# Patient Record
Sex: Male | Born: 1965 | Race: White | Hispanic: No | Marital: Married | Smoking: Former smoker
Health system: Southern US, Community
[De-identification: ages and names within clinical notes are randomized; demographics above are authoritative.]

## PROBLEM LIST (undated history)

## (undated) DIAGNOSIS — Z8619 Personal history of other infectious and parasitic diseases: Secondary | ICD-10-CM

## (undated) DIAGNOSIS — F411 Generalized anxiety disorder: Secondary | ICD-10-CM

## (undated) HISTORY — DX: Personal history of other infectious and parasitic diseases: Z86.19

## (undated) HISTORY — DX: Generalized anxiety disorder: F41.1

## (undated) HISTORY — PX: TONSILLECTOMY AND ADENOIDECTOMY: SUR1326

## (undated) HISTORY — DX: Gilbert syndrome: E80.4

## (undated) HISTORY — PX: HERNIA REPAIR: SHX51

---

## 2003-08-10 HISTORY — PX: FINGER SURGERY: SHX640

## 2006-08-05 ENCOUNTER — Emergency Department (HOSPITAL_COMMUNITY): Admission: EM | Admit: 2006-08-05 | Discharge: 2006-08-05 | Payer: Self-pay | Admitting: Family Medicine

## 2006-08-11 ENCOUNTER — Emergency Department (HOSPITAL_COMMUNITY): Admission: EM | Admit: 2006-08-11 | Discharge: 2006-08-11 | Payer: Self-pay | Admitting: Family Medicine

## 2008-06-06 ENCOUNTER — Ambulatory Visit: Payer: Self-pay | Admitting: Internal Medicine

## 2008-06-06 DIAGNOSIS — J329 Chronic sinusitis, unspecified: Secondary | ICD-10-CM | POA: Insufficient documentation

## 2008-06-06 DIAGNOSIS — R000 Tachycardia, unspecified: Secondary | ICD-10-CM | POA: Insufficient documentation

## 2008-06-06 DIAGNOSIS — G4700 Insomnia, unspecified: Secondary | ICD-10-CM | POA: Insufficient documentation

## 2008-06-27 ENCOUNTER — Ambulatory Visit: Payer: Self-pay | Admitting: Internal Medicine

## 2008-06-27 LAB — CONVERTED CEMR LAB
ALT: 23 units/L (ref 0–53)
Basophils Relative: 0.7 % (ref 0.0–3.0)
Bilirubin, Direct: 0.2 mg/dL (ref 0.0–0.3)
CO2: 32 meq/L (ref 19–32)
Calcium: 9.1 mg/dL (ref 8.4–10.5)
Glucose, Bld: 109 mg/dL — ABNORMAL HIGH (ref 70–99)
Hemoglobin: 16.5 g/dL (ref 13.0–17.0)
Leukocytes, UA: NEGATIVE
Lymphocytes Relative: 33.3 % (ref 12.0–46.0)
Monocytes Relative: 9.7 % (ref 3.0–12.0)
Neutro Abs: 2.8 10*3/uL (ref 1.4–7.7)
Nitrite: NEGATIVE
RBC: 5.01 M/uL (ref 4.22–5.81)
RDW: 11.4 % — ABNORMAL LOW (ref 11.5–14.6)
Sodium: 143 meq/L (ref 135–145)
Specific Gravity, Urine: >=1.03 (ref 1.000–1.03)
TSH: 0.53 microintl units/mL (ref 0.35–5.50)
Total CHOL/HDL Ratio: 5.8
Total Protein: 7 g/dL (ref 6.0–8.3)
WBC: 5.1 10*3/uL (ref 4.5–10.5)
pH: 5.5 (ref 5.0–8.0)

## 2008-06-28 ENCOUNTER — Telehealth: Payer: Self-pay | Admitting: Internal Medicine

## 2008-06-30 ENCOUNTER — Ambulatory Visit: Payer: Self-pay | Admitting: Internal Medicine

## 2008-07-04 ENCOUNTER — Telehealth: Payer: Self-pay | Admitting: Internal Medicine

## 2008-07-04 ENCOUNTER — Ambulatory Visit: Payer: Self-pay | Admitting: Cardiology

## 2010-06-18 ENCOUNTER — Ambulatory Visit: Payer: Self-pay | Admitting: Internal Medicine

## 2010-06-18 ENCOUNTER — Ambulatory Visit: Payer: Self-pay | Admitting: Cardiology

## 2010-06-18 DIAGNOSIS — R109 Unspecified abdominal pain: Secondary | ICD-10-CM

## 2010-06-18 LAB — CONVERTED CEMR LAB
BUN: 13 mg/dL (ref 6–23)
Basophils Absolute: 0 10*3/uL (ref 0.0–0.1)
Basophils Relative: 1 % (ref 0–1)
Blood in Urine, dipstick: NEGATIVE
Calcium: 9.9 mg/dL (ref 8.4–10.5)
Creatinine, Ser: 1.04 mg/dL (ref 0.40–1.50)
Eosinophils Absolute: 0.1 10*3/uL (ref 0.0–0.7)
Eosinophils Relative: 1 % (ref 0–5)
HCT: 46.4 % (ref 39.0–52.0)
Hemoglobin: 16.2 g/dL (ref 13.0–17.0)
MCHC: 34.9 g/dL (ref 30.0–36.0)
MCV: 90.4 fL (ref 78.0–100.0)
Monocytes Absolute: 0.6 10*3/uL (ref 0.1–1.0)
Nitrite: NEGATIVE
Protein, U semiquant: NEGATIVE
RDW: 12.1 % (ref 11.5–15.5)
Specific Gravity, Urine: 1.01
Urobilinogen, UA: 0.2
WBC Urine, dipstick: NEGATIVE

## 2010-06-19 ENCOUNTER — Encounter: Payer: Self-pay | Admitting: Internal Medicine

## 2010-06-19 ENCOUNTER — Telehealth: Payer: Self-pay | Admitting: Internal Medicine

## 2010-07-03 ENCOUNTER — Telehealth: Payer: Self-pay | Admitting: Internal Medicine

## 2010-09-28 ENCOUNTER — Ambulatory Visit
Admission: RE | Admit: 2010-09-28 | Discharge: 2010-09-28 | Payer: Self-pay | Source: Home / Self Care | Attending: Family | Admitting: Family

## 2010-09-28 DIAGNOSIS — J069 Acute upper respiratory infection, unspecified: Secondary | ICD-10-CM | POA: Insufficient documentation

## 2010-10-09 NOTE — Progress Notes (Signed)
Summary: Status Update  ---- Converted from flag ---- ---- 06/29/2010 5:31 PM, D. Thomos Lemons DO wrote: call pt on Monday AM.  Abd pain resolved? ------------------------------  Phone Note Outgoing Call   Call placed by: Glendell Docker CMA,  July 03, 2010 8:35 AM Call placed to: Patient Summary of Call: call placed to patient at [phone]  per Dr Artist Pais instructions. Patient states that his abdomen pain has resolved and he is doing much better. He has no concerns at this time. Initial call taken by: Glendell Docker CMA,  July 03, 2010 8:38 AM

## 2010-10-09 NOTE — Assessment & Plan Note (Signed)
Summary: ABD PAIN/HEA   Vital Signs:  Patient profile:   45 year old male Height:      71 inches Weight:      173.25 pounds BMI:     24.25 O2 Sat:      99 % on Room air Temp:     98.3 degrees F oral Pulse rate:   92 / minute Pulse rhythm:   irregular Resp:     18 per minute BP sitting:   106 / 70  (right arm) Cuff size:   regular  Vitals Entered By: Glendell Docker CMA (June 18, 2010 2:10 PM)  O2 Flow:  Room air CC: abdominal pain Is Patient Diabetic? No Pain Assessment Patient in pain? yes     Location: abdomen Intensity: 4 Type: aching Onset of pain  Constant Comments c/o lower abdominal pain for the past week started out in lower back, evaluation of knot mid sternum, onset a month ago, denies associated pain   Primary Care Provider:  Dondra Spry DO  CC:  abdominal pain.  History of Present Illness: 45 y/o white male c/o abd pain started with left flank pain woke him up,  initally intense and sharp  back pain has improved but he has lower pelvic discomfort described as dull ache some activity made it worse  no change in bowel  frequent urination no other urinary  Preventive Screening-Counseling & Management  Alcohol-Tobacco     Smoking Status: quit  Allergies (verified): No Known Drug Allergies  Past History:  Past Medical History: Chronic insomnia associated with shift work Allergic rhinitis  Hx of chronic sinusitis - Previously evaluated by ENT Dr. 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   Lungs:  normal respiratory effort and normal breath sounds.   Heart:  normal rate, regular rhythm, and no gallop.   Extremities:  No clubbing, cyanosis, edema   Impression & Recommendations:  Problem # 1:  ABDOMINAL PAIN, LOWER (ICD-789.09) abd pain suspicous for kidney stone.  check CT of abd and pelvis increase fluids use vicodin as directed Patient advised to call office if symptoms persist or worsen.  Orders: T-Basic Metabolic Panel [id number]) T-CBC w/Diff [phone]) CT without Contrast (CT w/o contrast)  His updated medication list for this problem includes:    Hydrocodone-acetaminophen 5-500 Mg Tabs (Hydrocodone-acetaminophen) ..... One by mouth two times a day prn  Complete Medication List: 1)  Fish Oil 1000 Mg Caps (Omega-3 fatty acids) .... Take 1 capsule by mouth once a day 2)  L-lysine 500 Mg Tabs (Lysine) .... Take 1 tablet by mouth once a day 3)  Hydrocodone-acetaminophen 5-500 Mg Tabs (Hydrocodone-acetaminophen) .... One by mouth two times a day prn 4)  Levofloxacin 500 Mg Tabs (Levofloxacin) .... One by mouth once daily  Other Orders: UA Dipstick w/o Micro (manual) (53664) Flu Vaccine 41yrs + (40347) Admin 1st Vaccine (42595)  Patient Instructions: 1)  We will call you re:  CT of abd and pelvis  results Prescriptions: HYDROCODONE-ACETAMINOPHEN 5-500 MG TABS (HYDROCODONE-ACETAMINOPHEN) one by mouth two times a day prn  #30 x 0   Entered and Authorized by:   D. Thomos Lemons DO   Signed by:   D. Thomos Lemons DO on 06/18/2010   Method used:   Print then Give to Patient   RxID:   [id number]   Current Allergies (reviewed today): No known allergies     Immunizations Administered:  Influenza Vaccine # 1:    Vaccine Type: Fluvax 3+    Site: left deltoid    Mfr: GlaxoSmithKline    Dose: 0.5 ml    Route: IM    Given by: Glendell Docker CMA    Exp. Date: 03/09/2011    Lot #: [id number]     VIS given: 04/03/10 version given June 18, 2010.  Flu Vaccine Consent Questions:    Do you have a history of severe allergic reactions to this vaccine? no    Any prior history of allergic reactions to egg and/or gelatin? no    Do you have a sensitivity to the preservative Thimersol? no    Do you have a past history of Guillan-Barre Syndrome? no    Do you currently have an acute febrile illness? no    Have you ever had a severe reaction to latex? no    Vaccine information given and explained to patient? yes   Laboratory Results   Urine Tests    Routine Urinalysis   Color: yellow Appearance: Clear Glucose: negative   (Normal Range: Negative) Bilirubin: negative   (Normal Range: Negative) Ketone: negative   (Normal Range: Negative) Spec. Gravity: 1.010   (Normal Range: 1.003-1.035) Blood: negative   (Normal Range: Negative) pH: 5.0   (Normal Range: 5.0-8.0) Protein: negative   (Normal Range: Negative) Urobilinogen: 0.2   (Normal Range: 0-1) Nitrite: negative   (Normal Range: Negative) Leukocyte Esterace: negative   (Normal Range: Negative)

## 2010-10-09 NOTE — Progress Notes (Signed)
  Phone Note Outgoing Call   Summary of Call: LMOVM - pt advised to call back re:  CT results Initial call taken by: D. Thomos Lemons DO,  June 19, 2010 1:54 PM  Follow-up for Phone Call        informed pt of CT results.  pt still having lower abd pain he also describes intermittent scrotal discomfort. question epididymitis. tx with levaquin pt to call back end of week if symptoms not improved Follow-up by: D. Thomos Lemons DO,  June 19, 2010 2:19 PM    New/Updated Medications: LEVOFLOXACIN 500 MG TABS (LEVOFLOXACIN) one by mouth once daily Prescriptions: LEVOFLOXACIN 500 MG TABS (LEVOFLOXACIN) one by mouth once daily  #10 x 0   Entered and Authorized by:   D. Thomos Lemons DO   Signed by:   D. Thomos Lemons DO on 06/19/2010   Method used:   Electronically to        [facility] # [phone]* (retail)       [address] [place], [place]  [address]       Ph: [phone]       Fax: [phone]   RxID:   [id number]

## 2010-10-11 NOTE — Assessment & Plan Note (Signed)
Summary: laringytis/mhf--rm 4   Vital Signs:  Patient profile:   45 year old male Height:      71 inches Weight:      179 pounds BMI:     25.06 Temp:     97.9 degrees F oral Pulse rate:   84 / minute Pulse rhythm:   regular Resp:     16 per minute BP sitting:   122 / 80  (right arm) Cuff size:   regular  Vitals Entered By: Mervin Kung CMA Duncan Dull) (September 28, 2010 8:42 AM) CC: Pt states he has had sinus drainage, sore throat and low grade fever x 1 day. Is Patient Diabetic? No Pain Assessment Patient in pain? no      Comments Pt has completed hydrocodone and levofloxacin.  All other med doses and directions are correct. Nicki Guadalajara Fergerson CMA Duncan Dull)  September 28, 2010 8:47 AM    Primary Care Provider:  Dondra Spry DO  CC:  Pt states he has had sinus drainage and sore throat and low grade fever x 1 day.Marland Kitchen  History of Present Illness: Xavier Gonzalez is a 45 year old male with 2-3 day of hx of nasal congestion.  Low grade temperature. Tmax 101 yesterday.   Laryngitis since yesterday.  Mild cough. Mild yellow discharge when he blows his nose.  Has tried sudafed, alka selzer, sinus rinse with mild improvement.  Has not taken any medications this AM.  Allergies (verified): No Known Drug Allergies  Past History:  Past Medical History: Last updated: 06/18/2010 Chronic insomnia associated with shift work Allergic rhinitis  Hx of chronic sinusitis - Previously evaluated by ENT Dr. Christell Constant in [facility] Gilbert's Syndrome   Past Surgical History: Last updated: 06/18/2010 Reconstructive hand surgery 2004-2005 for crushed fifth digit Tonsillectomy 1988     Review of Systems       see HPI  Physical Exam  General:  Well-developed,well-nourished,in no acute distress; alert,appropriate and cooperative throughout examination Head:  Normocephalic and atraumatic without obvious abnormalities. No apparent alopecia or balding. Eyes:  PERRLA, sclera clear Mouth:  + pharyngeal erythema  without exudates Neck:  No deformities, masses, or tenderness noted. Lungs:  Normal respiratory effort, chest expands symmetrically. Lungs are clear to auscultation, no crackles or wheezes. Heart:  Normal rate and regular rhythm. S1 and S2 normal without gallop, murmur, click, rub or other extra sounds. Psych:  Cognition and judgment appear intact. Alert and cooperative with normal attention span and concentration. No apparent delusions, illusions, hallucinations   Impression & Recommendations:  Problem # 1:  UPPER RESPIRATORY INFECTION (ICD-465.9) Assessment New  Rapid strep is negative.  Symptoms consistent with URI.  Recommended supportive measures as listed in pt. sign out.  Pt instructed to call if symptoms worsen, or if no improvement in 48-72 hours.    Complete Medication List: 1)  Fish Oil 1000 Mg Caps (Omega-3 fatty acids) .... Take 1 capsule by mouth once a day 2)  L-lysine 500 Mg Tabs (Lysine) .... Take 1 tablet by mouth once a day  Other Orders: Rapid Strep ([address])  Patient Instructions: 1)  Gargle twice daily with salt water. 2)  Take Tylenol 650mg  every 6 hours as needed for pain 3)  You may use over the counter Cepacol lozenges or Chloraseptic spray as needed for sore throat. 4)  Call if you are not feeling better in 48 to 72 hours.   Orders Added: 1)  Rapid Strep [87880] 2)  Est. Patient Level III [[address]]  Current Allergies (reviewed today): No known allergies   Laboratory Results   Date/Time Reported: Mervin Kung CMA Duncan Dull)  September 28, 2010 9:23 AM   Other Tests  Rapid Strep: negative  Kit Test Internal QC: Positive   (Normal Range: Negative)

## 2011-10-09 ENCOUNTER — Emergency Department (INDEPENDENT_AMBULATORY_CARE_PROVIDER_SITE_OTHER)
Admission: EM | Admit: 2011-10-09 | Discharge: 2011-10-09 | Disposition: A | Discharge status: Home / Self Care | Payer: Managed Care, Other (non HMO) | Source: Home / Self Care | Attending: Emergency Medicine | Admitting: Emergency Medicine

## 2011-10-09 DIAGNOSIS — R0789 Other chest pain: Secondary | ICD-10-CM

## 2011-10-09 NOTE — ED Provider Notes (Signed)
History     CSN: [id number]  Arrival date & time 10/09/11  1044   First MD Initiated Contact with Patient 10/09/11 1119      No chief complaint on file.   (Consider location/radiation/quality/duration/timing/severity/associated sxs/prior treatment) HPI He complains of left-sided chest pain for last 4-5 days. 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 No shortness of breath, sweating. He does not have any history of any major medical issues, no hypertension. No cholesterol. His parents are both alive and healthy. He is not a smoker. He is always been thin and healthy. No upper respiratory symptoms. No other symptoms at all. He states that at worst the pain is about 2 or 3/10 and penicillin there for a while. He states that when he presses on his left chest it tends to go low worst.  No past medical history on file.  No past surgical history on file.  No family history on file.  History  Substance Use Topics  . Smoking status: Not on file  . Smokeless tobacco: Not on file  . Alcohol Use: Not on file      Review of Systems  Allergies  Review of patient's allergies indicates not on file.  Home Medications  No current outpatient prescriptions on file.  There were no vitals taken for this visit.  Physical Exam  Nursing note and vitals reviewed. Constitutional: He is oriented to person, place, and time. He appears well-developed and well-nourished.  HENT:  Head: Normocephalic and atraumatic.  Eyes: No scleral icterus.  Neck: Neck supple.  Cardiovascular: Normal rate, regular rhythm, normal heart sounds and normal pulses.   Pulmonary/Chest: Effort normal and breath sounds normal. No respiratory distress. He has no wheezes. He has no rhonchi. He exhibits tenderness. He exhibits no mass, no crepitus, no edema and no swelling.    Neurological: He is alert and oriented to person, place, and time.  Skin: Skin is warm and dry.    Psychiatric: He has a normal mood and affect. His speech is normal.    ED Course  Procedures (including critical care time)  Labs Reviewed - No data to display No results found.   1. Atypical chest pain       MDM   I do not believe that his chest pain is cardiac in etiology as he has absolutely no risk factors and that it has been lingering for the last 4-5 days. I also do not feel that this is reflux occurred, however I told him that it may be reasonable to take some Prilosec over-the-counter for the next few days to see if this does help relieve it. Instead I feel the diagnosis most appropriate as costochondritis or chest wall strain. I've educated him on this. I gave him a handout. ER precautions for worsening chest pain, shortness of breath. Or if the pain is just continuing despite conservative treatment, he will call back clinic and we will get him in to see a cardiologist, likely for an exercise stress test. An EKG was done in clinic.  Lily Kocher, MD 10/09/11 1124

## 2011-10-12 ENCOUNTER — Telehealth: Payer: Self-pay | Admitting: Family Medicine

## 2012-01-31 ENCOUNTER — Emergency Department
Admission: EM | Admit: 2012-01-31 | Discharge: 2012-01-31 | Disposition: A | Discharge status: Home / Self Care | Payer: Managed Care, Other (non HMO) | Source: Home / Self Care | Attending: Emergency Medicine | Admitting: Emergency Medicine

## 2012-01-31 ENCOUNTER — Encounter: Payer: Self-pay | Admitting: Family Medicine

## 2012-01-31 ENCOUNTER — Ambulatory Visit (INDEPENDENT_AMBULATORY_CARE_PROVIDER_SITE_OTHER): Payer: Managed Care, Other (non HMO) | Admitting: Family Medicine

## 2012-01-31 ENCOUNTER — Encounter: Payer: Self-pay | Admitting: *Deleted

## 2012-01-31 VITALS — BP 127/89 | HR 102 | Ht 71.0 in | Wt 174.0 lb

## 2012-01-31 DIAGNOSIS — R229 Localized swelling, mass and lump, unspecified: Secondary | ICD-10-CM

## 2012-01-31 DIAGNOSIS — R222 Localized swelling, mass and lump, trunk: Secondary | ICD-10-CM

## 2012-01-31 NOTE — ED Provider Notes (Signed)
History     CSN: [id number]  Arrival date & time 01/31/12  1137   First MD Initiated Contact with Patient 01/31/12 1143      Chief Complaint  Patient presents with  . Mass    lump on left side    (Consider location/radiation/quality/duration/timing/severity/associated sxs/prior treatment) HPI This patient was diagnosed with costochondritis about 10 weeks ago.  He still says that he has a lump and swelling on the left side especially for the last month or so.  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  We'll swelling is not really painful but occasionally he does have a twinge of pain on that side.  Her cardiac risk factors, no hypertension no dyslipidemia, nonsmoker, no family history of cardiovascular disease, and he is active without any chest pain.  No shortness of breath.  History reviewed. No pertinent past medical history.  History reviewed. No pertinent past surgical history.  No family history on file.  History  Substance Use Topics  . Smoking status: Former Games developer  . Smokeless tobacco: Never Used  . Alcohol Use: Yes      Review of Systems  All other systems reviewed and are negative.    Allergies  Review of patient's allergies indicates no known allergies.  Home Medications   Current Outpatient Rx  Name Route Sig Dispense Refill  . LYSINE 500 MG PO CAPS Oral Take by mouth.      BP 146/84  Pulse 91  Temp(Src) 98.4 F (36.9 C) (Oral)  Resp 14  Ht 6' (1.829 m)  Wt 173 lb (78.472 kg)  BMI 23.46 kg/m2  SpO2 97%  Physical Exam  Nursing note and vitals reviewed. Constitutional: He is oriented to person, place, and time. He appears well-developed and well-nourished.  HENT:  Head: Normocephalic and atraumatic.  Eyes: No scleral icterus.  Neck: Neck supple.  Cardiovascular: Regular rhythm and normal heart sounds.   Pulmonary/Chest: Effort normal and breath sounds normal. No respiratory distress. He  has no decreased breath sounds. He has no wheezes. He has no rhonchi.         When he raises his arms there is an area of mild swelling compared to the contralateral side which is approximately 7 cm in size.  It is at the general area of the serratus anterior muscle.  No tenderness to palpation.  Neurological: He is alert and oriented to person, place, and time.  Skin: Skin is warm and dry.  Psychiatric: He has a normal mood and affect. His speech is normal.    ED Course  Procedures (including critical care time)  Labs Reviewed - No data to display No results found.   1. Soft tissue swelling of chest wall       MDM   I would refer him to Dr. Pearletha Forge in sports medicine.  I let him know that probably the best modality at this time would be to get an ultrasound of the area to rule out a cyst.  This is likely soft tissue swelling secondary to a serratus anterior strain.  With his continued golf and his job, he is likely not rested enough.  I do not believe that any kind of intrathoracic etiology, however if nothing is found on the ultrasound, that I advised him to followup with his PCP.  At that time a chest x-ray or chest CT could be offered versus blood work.  ER precautions are given for severe chest  pain, shortness of breath.  Marlaine Hind, MD 01/31/12 [phone]

## 2012-01-31 NOTE — Progress Notes (Signed)
  Subjective:    Patient ID: Xavier Gonzalez, male    DOB: October 28, 1965, 46 y.o.   MRN: [id number]  PCP: None  HPI 46 yo M here for left chest wall mass.  Patient was seen previously at [facility] for left sided chest pain (3 months ago) - dx with costochondritis. Over past 4 weeks noticed increased swelling left side of chest wall laterally. Initially was painful here but now not having pain in this location. No redness, drainage. No pain with movements. He does golf a lot but doesn't recall any specific injury. Iced before, not on any medications. Here for an ultrasound.  No past medical history on file.  Current Outpatient Prescriptions on File Prior to Visit  Medication Sig Dispense Refill  . Lysine 500 MG CAPS Take by mouth.        No past surgical history on file.  No Known Allergies  History   Social History  . Marital Status: Married    Spouse Name: N/A    Number of Children: N/A  . Years of Education: N/A   Occupational History  . Not on file.   Social History Main Topics  . Smoking status: Former Games developer  . Smokeless tobacco: Never Used  . Alcohol Use: Yes  . Drug Use: No  . Sexually Active:    Other Topics Concern  . Not on file   Social History Narrative  . No narrative on file    No family history on file.  BP 127/89  Pulse 102  Ht 5\' 11"  (1.803 m)  Wt 174 lb (78.926 kg)  BMI 24.27 kg/m2  Review of Systems See HPI above.    Objective:   Physical Exam Gen: NAD  Chest: Mild swelling left serratus anterior muscle compared to right.  No bruising, erythema, palpable mass. No TTP throughout this area or left chest wall. FROM shoulders - no pain and no scapular winging. Strength 5/5 shoulders.  MSK u/s: No evidence of cyst, lipoma in area of swelling.  Muscle fibers of serratus without large tear or defect.  He does have increased neovascularity within musculature in area of swelling.  No bony irregularities of ribs.     Assessment & Plan:  1.  Chest mass - consistent with serratus anterior strain/spasm.  No evidence bony abnormalities, lipoma, cyst in this location.  I reassured patient from an MSK perspective.  Offered PT but he declined - to call if he wants to go ahead with this but only intermittently having pain now.  Agree with prior assessment that if pain worsens would consider referring him to cardiology for eval, possible stress test - only risk factor remote smoking history though.  He will f/u with [place] prn.

## 2012-01-31 NOTE — ED Notes (Signed)
Patient reports he noticed a lump/swelling on his left side x 3-4 weeks. Plays golf frequently.

## 2012-01-31 NOTE — Assessment & Plan Note (Signed)
consistent with serratus anterior strain/spasm.  No evidence bony abnormalities, lipoma, cyst in this location.  I reassured patient from an MSK perspective.  Offered PT but he declined - to call if he wants to go ahead with this but only intermittently having pain now.  Agree with prior assessment that if pain worsens would consider referring him to cardiology for eval, possible stress test - only risk factor remote smoking history though.  He will f/u with [place] prn.

## 2012-08-14 DIAGNOSIS — M79646 Pain in unspecified finger(s): Secondary | ICD-10-CM | POA: Insufficient documentation

## 2012-08-14 DIAGNOSIS — T8484XA Pain due to internal orthopedic prosthetic devices, implants and grafts, initial encounter: Secondary | ICD-10-CM | POA: Insufficient documentation

## 2012-09-03 ENCOUNTER — Telehealth: Payer: Self-pay | Admitting: Emergency Medicine

## 2012-09-03 ENCOUNTER — Emergency Department (INDEPENDENT_AMBULATORY_CARE_PROVIDER_SITE_OTHER)
Admission: EM | Admit: 2012-09-03 | Discharge: 2012-09-03 | Disposition: A | Discharge status: Home / Self Care | Payer: Managed Care, Other (non HMO) | Source: Home / Self Care | Attending: Family Medicine | Admitting: Family Medicine

## 2012-09-03 ENCOUNTER — Encounter: Payer: Self-pay | Admitting: *Deleted

## 2012-09-03 DIAGNOSIS — J111 Influenza due to unidentified influenza virus with other respiratory manifestations: Secondary | ICD-10-CM

## 2012-09-03 LAB — POCT INFLUENZA A/B: Influenza B, POC: NEGATIVE

## 2012-09-03 MED ORDER — OSELTAMIVIR PHOSPHATE 75 MG PO CAPS: ORAL_CAPSULE | ORAL | Status: DC

## 2012-09-03 NOTE — ED Notes (Signed)
Patient c/o flu like symptoms, chills, fever high 100.7, sore throat, chest pain. Has tried OTC Nyquil, Ibuprofen, and cough drops with little relief.

## 2012-09-03 NOTE — ED Provider Notes (Signed)
History     CSN: [id number]  Arrival date & time 09/03/12  7829   First MD Initiated Contact with Patient 09/03/12 [phone]      Chief Complaint  Patient presents with  . Influenza   HPI  URI Symptoms Onset: 2 days  Description: generalized malaise and fatigue, low grade fever, rhinorrhea Modifying factors:  Has not had flu shot   Symptoms Nasal discharge: yes Fever: mild Sore throat: no Cough: mild Wheezing: no Ear pain: no GI symptoms: decreased appetite Sick contacts: unsure  Red Flags  Stiff neck: no Dyspnea: no Rash: no Swallowing difficulty: no  Sinusitis Risk Factors Headache/face pain: no Double sickening: no tooth pain: no  Allergy Risk Factors Sneezing: no Itchy scratchy throat: no Seasonal symptoms: no  Flu Risk Factors Headache: yes muscle aches: yes severe fatigue: yes    History reviewed. No pertinent past medical history.  History reviewed. No pertinent past surgical history.  History reviewed. No pertinent family history.  History  Substance Use Topics  . Smoking status: Former Games developer  . Smokeless tobacco: Never Used  . Alcohol Use: Yes      Review of Systems  All other systems reviewed and are negative.    Allergies  Review of patient's allergies indicates no known allergies.  Home Medications   Current Outpatient Rx  Name  Route  Sig  Dispense  Refill  . LYSINE 500 MG PO CAPS   Oral   Take by mouth.           BP 142/9  Pulse 103  Temp 98.1 F (36.7 C) (Oral)  Resp 18  Ht 6' (1.829 m)  Wt 175 lb 8 oz (79.606 kg)  BMI 23.80 kg/m2  SpO2 99%  Physical Exam  Constitutional:       Mildly ill appearing    HENT:  Head: Normocephalic and atraumatic.       +nasal erythema, rhinorrhea bilaterally, + post oropharyngeal erythema    Eyes: Conjunctivae normal are normal. Pupils are equal, round, and reactive to light.  Neck: Normal range of motion. Neck supple.  Cardiovascular: Normal rate and regular rhythm.     Pulmonary/Chest: Effort normal and breath sounds normal.  Abdominal: Soft.  Musculoskeletal: Normal range of motion.  Lymphadenopathy:    He has cervical adenopathy.  Neurological: He is alert.  Skin: Skin is warm.    ED Course  Procedures (including critical care time)   Labs Reviewed  POCT RAPID STREP A (OFFICE)  POCT INFLUENZA A/B   No results found.   1. Influenza       MDM  Rapid flu A positive.  Will start on tamfilu.  Prophylactic dosing for pt's wife.  Discussed contacting family members who have been in range within the last 24 hours.  Discussed infectious and resp red flags.  Follow up as needed.     The patient and/or caregiver has been counseled thoroughly with regard to treatment plan and/or medications prescribed including dosage, schedule, interactions, rationale for use, and possible side effects and they verbalize understanding. Diagnoses and expected course of recovery discussed and will return if not improved as expected or if the condition worsens. Patient and/or caregiver verbalized understanding.             Doree Albee, MD 09/03/12 1037

## 2016-06-12 ENCOUNTER — Ambulatory Visit (INDEPENDENT_AMBULATORY_CARE_PROVIDER_SITE_OTHER): Payer: BLUE CROSS/BLUE SHIELD | Admitting: Family Medicine

## 2016-06-12 ENCOUNTER — Encounter: Payer: Self-pay | Admitting: Family Medicine

## 2016-06-12 VITALS — BP 116/50 | HR 90 | Temp 98.2°F | Ht 72.0 in | Wt 184.8 lb

## 2016-06-12 DIAGNOSIS — Z13220 Encounter for screening for lipoid disorders: Secondary | ICD-10-CM

## 2016-06-12 DIAGNOSIS — Z131 Encounter for screening for diabetes mellitus: Secondary | ICD-10-CM

## 2016-06-12 DIAGNOSIS — E663 Overweight: Secondary | ICD-10-CM | POA: Diagnosis not present

## 2016-06-12 DIAGNOSIS — F411 Generalized anxiety disorder: Secondary | ICD-10-CM

## 2016-06-12 DIAGNOSIS — Z23 Encounter for immunization: Secondary | ICD-10-CM

## 2016-06-12 DIAGNOSIS — Z114 Encounter for screening for human immunodeficiency virus [HIV]: Secondary | ICD-10-CM

## 2016-06-12 LAB — HEMOGLOBIN A1C: HEMOGLOBIN A1C: 5.4 % (ref 4.6–6.5)

## 2016-06-12 LAB — COMPREHENSIVE METABOLIC PANEL
ALT: 21 U/L (ref 0–53)
AST: 18 U/L (ref 0–37)
Albumin: 4.1 g/dL (ref 3.5–5.2)
Alkaline Phosphatase: 76 U/L (ref 39–117)
BUN: 13 mg/dL (ref 6–23)
CALCIUM: 8.8 mg/dL (ref 8.4–10.5)
CHLORIDE: 104 meq/L (ref 96–112)
CO2: 30 meq/L (ref 19–32)
CREATININE: 0.97 mg/dL (ref 0.40–1.50)
GFR: 87.72 mL/min (ref >60.00)
GLUCOSE: 96 mg/dL (ref 70–99)
Potassium: 3.8 mEq/L (ref 3.5–5.1)
Sodium: 141 mEq/L (ref 135–145)
Total Bilirubin: 1.4 mg/dL — ABNORMAL HIGH (ref 0.2–1.2)
Total Protein: 7 g/dL (ref 6.0–8.3)

## 2016-06-12 LAB — LIPID PANEL
CHOL/HDL RATIO: 5
Cholesterol: 193 mg/dL (ref 0–200)
HDL: 38.6 mg/dL — AB (ref >39.00)
LDL CALC: 120 mg/dL — AB (ref 0–99)
NONHDL: 154.49
TRIGLYCERIDES: 170 mg/dL — AB (ref 0.0–149.0)
VLDL: 34 mg/dL (ref 0.0–40.0)

## 2016-06-12 LAB — HIV ANTIBODY (ROUTINE TESTING W REFLEX): HIV: NONREACTIVE

## 2016-06-12 MED ORDER — SERTRALINE HCL 50 MG PO TABS: 50.0000 mg | ORAL_TABLET | Freq: Every day | ORAL | 0 refills | Status: DC

## 2016-06-12 NOTE — Patient Instructions (Addendum)
Please consider counseling. The medical literature and evidence-based guidelines support it. Contact [phone] to schedule an appointment or inquire about cost/insurance coverage.

## 2016-06-12 NOTE — Progress Notes (Signed)
Pre visit review using our clinic review tool, if applicable. No additional management support is needed unless otherwise documented below in the visit note. 

## 2016-06-12 NOTE — Progress Notes (Signed)
Chief Complaint  Patient presents with  . Establish Care       New Patient Visit SUBJECTIVE: HPI: Xavier Gonzalez is an 50 y.o.male who is being seen for establishing care.  The patient was previously seen with Dr. Shawna Orleans.  Anxiety/anger As long as his mom can remember he has had anger issues. He has been married to his wife for 20 years and she has seen it get worse over time. They see a counselor together. His daughter was just diagnosed with anxiety and their counselor believes he should be started on something. He has a history of panic attacks causing a cardiac workup. He will sometimes have racing thoughts and difficulty sleeping. No self-medication or suicidal/homicidal thoughts. He does not have a depressed mood or decreased interest in doing things.    No Known Allergies  Past Medical History:  Diagnosis Date  . Gilbert's disease   . History of chicken pox    Past Surgical History:  Procedure Laterality Date  . FINGER SURGERY  08/2003  . HERNIA REPAIR    . TONSILLECTOMY AND ADENOIDECTOMY Bilateral    Social History   Social History  . Marital status: Married   Social History Main Topics  . Smoking status: Former Research scientist (life sciences)  . Smokeless tobacco: Never Used  . Alcohol use Yes  . Drug use: No   Family History  Problem Relation Age of Onset  . Diabetes Mother   . COPD Mother   . Hypertension Father   . Anxiety disorder Daughter   . Diabetes Maternal Grandmother   . COPD Maternal Grandmother   . Cancer Maternal Uncle     Current Outpatient Prescriptions:  .  L-Lysine 500 MG TABS, Take 1,000 mg by mouth daily., Disp: , Rfl:  .  sertraline (ZOLOFT) 50 MG tablet, Take 1 tablet (50 mg total) by mouth daily., Disp: 60 tablet, Rfl: 0  ROS Cardiovascular: Denies weight changes  Psych: As noted in HPI   OBJECTIVE: BP (!) 116/50 (BP Location: Left Arm, Patient Position: Sitting, Cuff Size: Large)   Pulse 90   Temp 98.2 F (36.8 C) (Oral)   Ht 6' (1.829 m)   Wt 184 lb  12.8 oz (83.8 kg)   SpO2 98%   BMI 25.06 kg/m   Constitutional: -  VS reviewed -  Well developed, well nourished, appears stated age -  No apparent distress  Psychiatric: -  Oriented to person, place, and time -  Memory intact -  Affect and mood normal -  Fluent conversation, good eye contact -  Judgment and insight age appropriate  Eye: -  Conjunctivae clear, no discharge -  Pupils symmetric, round, reactive to light  ENMT: -  Ears are patent b/l without erythema or discharge. TM's are shiny and clear b/l without evidence of effusion or infection. -  Oral mucosa without lesions, tongue and uvula midline    Tonsils not enlarged, no erythema, no exudate, trachea midline    Pharynx moist, no lesions, no erythema  Neck: -  No gross swelling, no palpable masses -  Thyroid midline, not enlarged, mobile, no palpable masses  Cardiovascular: -  RRR, no murmurs -  No LE edema  Respiratory: -  Normal respiratory effort, no accessory muscle use, no retraction -  Breath sounds equal, no wheezes, no ronchi, no crackles  Neurological:  -  CN II - XII grossly intact -  Sensation grossly intact to light touch, equal bilaterally  Skin: -  No significant lesion on  inspection -  Warm and dry to palpation   ASSESSMENT/PLAN: Generalized anxiety disorder - Plan: sertraline (ZOLOFT) 50 MG tablet  Screening cholesterol level - Plan: Lipid panel  Screening for HIV (human immunodeficiency virus) - Plan: HIV antibody  Screening for diabetes mellitus - Plan: Hemoglobin A1c  Overweight (BMI 25.0-29.9) - Plan: Comprehensive metabolic panel  Orders as above. 25 mg for 2 weeks and then 50 mg daily. Number given for counseling should he pursue it. Patient should return in 4-6 weeks to recheck mood. The patient voiced understanding and agreement to the plan.   [place], DO 06/12/16  9:12 AM

## 2016-07-24 ENCOUNTER — Encounter: Payer: Self-pay | Admitting: Family Medicine

## 2016-07-24 ENCOUNTER — Ambulatory Visit (INDEPENDENT_AMBULATORY_CARE_PROVIDER_SITE_OTHER): Payer: BLUE CROSS/BLUE SHIELD | Admitting: Family Medicine

## 2016-07-24 VITALS — BP 120/80 | HR 64 | Temp 98.0°F | Ht 71.0 in | Wt 182.2 lb

## 2016-07-24 DIAGNOSIS — F411 Generalized anxiety disorder: Secondary | ICD-10-CM

## 2016-07-24 MED ORDER — SERTRALINE HCL 50 MG PO TABS: 50.0000 mg | ORAL_TABLET | Freq: Every day | ORAL | 0 refills | Status: DC

## 2016-07-24 NOTE — Progress Notes (Signed)
Chief Complaint  Patient presents with  . Follow-up    Subjective Xavier Gonzalez presents for f/u anxiety/depression.  Diagnosed for first time around 6 weeks ago and started on Zoloft. Side effects include falling asleep earlier in night, still gets around same amount of sleep. Does not notice a significant difference, though both he and his wife believe he is less irritable and having less outbursts.  +social stressors in selling house. Has not tried anything else; is in group counseling with wife and daughter. No thoughts of harming self or others. No self-medication with alcohol, prescription drugs or illicit drugs. He believes his dose should be unchanged.  ROS Psych: No homicidal or suicidal thoughts  Past Medical History:  Diagnosis Date  . Gilbert's disease   . History of chicken pox    Family History  Problem Relation Age of Onset  . Diabetes Mother   . COPD Mother   . Hypertension Father   . Anxiety disorder Daughter   . Diabetes Maternal Grandmother   . COPD Maternal Grandmother   . Cancer Maternal Uncle      Medication List       Accurate as of 07/24/16  7:54 AM. Always use your most recent med list.          L-Lysine 500 MG Tabs Take 1,000 mg by mouth daily.   sertraline 50 MG tablet Commonly known as:  ZOLOFT Take 1 tablet (50 mg total) by mouth daily.       Exam BP 120/80 (BP Location: Left Arm, Patient Position: Sitting, Cuff Size: Small)   Pulse 64   Temp 98 F (36.7 C) (Oral)   Ht 5\' 11"  (1.803 m)   Wt 182 lb 3.2 oz (82.6 kg)   SpO2 98%   BMI 25.41 kg/m  General:  well developed, well nourished, in no apparent distress Neck: neck supple without adenopathy, thyromegaly, or masses Lungs:  clear to auscultation, breath sounds equal bilaterally, no respiratory distress Cardio:  regular rate and rhythm without murmurs, heart sounds without clicks or rubs Psych: well oriented with normal range of affect and age-appropriate  judgement/insight, alert and oriented x4.  Assessment and Plan  Generalized anxiety disorder - Plan: sertraline (ZOLOFT) 50 MG tablet  Orders as above. Doing well. Told pt that my goal is not for him to feel significantly better, as he was not having depression or chronic anxiety. He does feel his anger is more even keel. Keep dose the same for now.  F/u in 2 mo. If doing well at that time, will space out to 4 mo and then every 6 mo. The patient voiced understanding and agreement to the plan.  [place], DO 07/24/16 7:54 AM

## 2016-07-24 NOTE — Progress Notes (Signed)
Pre visit review using our clinic review tool, if applicable. No additional management support is needed unless otherwise documented below in the visit note. 

## 2016-09-23 ENCOUNTER — Encounter: Payer: Self-pay | Admitting: Family Medicine

## 2016-09-23 ENCOUNTER — Ambulatory Visit (INDEPENDENT_AMBULATORY_CARE_PROVIDER_SITE_OTHER): Payer: BLUE CROSS/BLUE SHIELD | Admitting: Family Medicine

## 2016-09-23 VITALS — BP 122/70 | HR 70 | Temp 97.7°F | Ht 71.0 in | Wt 183.0 lb

## 2016-09-23 DIAGNOSIS — F411 Generalized anxiety disorder: Secondary | ICD-10-CM

## 2016-09-23 MED ORDER — SERTRALINE HCL 50 MG PO TABS: 50.0000 mg | ORAL_TABLET | Freq: Every day | ORAL | 1 refills | Status: DC

## 2016-09-23 NOTE — Progress Notes (Signed)
Chief Complaint  Patient presents with  . Follow-up    Pt reports anxiety has been much better    Subjective Xavier Gonzalez presents for f/u anxiety/depression. He is alone today.  Diagnosed 06/12/16. On Zoloft 50 mg daily. Doing well, no side effects or concerns. Believes his dose should be the same.  He does not notice a significant difference, but his family members do and would like him to stay on it. He is no longer seeing a counselor as things have improved. No thoughts of harming self or others. No self-medication with alcohol, prescription drugs or illicit drugs.  ROS Psych: No homicidal or suicidal thoughts  Past Medical History:  Diagnosis Date  . GAD (generalized anxiety disorder)   . Gilbert's disease   . History of chicken pox    Family History  Problem Relation Age of Onset  . Diabetes Mother   . COPD Mother   . Hypertension Father   . Anxiety disorder Daughter   . Diabetes Maternal Grandmother   . COPD Maternal Grandmother   . Cancer Maternal Uncle    Allergies as of 09/23/2016   No Known Allergies     Medication List       Accurate as of 09/23/16  7:25 AM. Always use your most recent med list.          L-Lysine 500 MG Tabs Take 1,000 mg by mouth daily.   sertraline 50 MG tablet Commonly known as:  ZOLOFT Take 1 tablet (50 mg total) by mouth daily.       Exam BP 122/70 (BP Location: Left Arm, Patient Position: Sitting, Cuff Size: Small)   Pulse 70   Temp 97.7 F (36.5 C) (Oral)   Ht '5\' 11"'  (1.803 m)   Wt 183 lb (83 kg)   SpO2 98%   BMI 25.52 kg/m  General:  well developed, well nourished, in no apparent distress Lungs:  clear to auscultation, breath sounds equal bilaterally, no respiratory distress Cardio:  regular rate and rhythm without murmurs, heart sounds without clicks or rubs Psych: well oriented with normal range of affect and age-appropriate judgement/insight, alert and oriented x4.  Assessment and Plan  Generalized anxiety  disorder - Plan: sertraline (ZOLOFT) 50 MG tablet  Continue Zoloft at current dose. Did discuss weaning down, which is a goal of his in the future. Would like to do this at a different time though. Seems to be in high spirits today, more talkative since I met him. Good to see. F/u in 4 mo. If he is doing well, will see every 6 mo or prn. Could also discuss weaning off and seeing me yearly for CPE's.  The patient voiced understanding and agreement to the plan.  [place], DO 09/23/16 7:25 AM

## 2016-09-23 NOTE — Patient Instructions (Signed)
Keep up the good work

## 2016-09-23 NOTE — Progress Notes (Signed)
Pre visit review using our clinic review tool, if applicable. No additional management support is needed unless otherwise documented below in the visit note. 

## 2016-11-05 ENCOUNTER — Telehealth: Payer: Self-pay | Admitting: Family Medicine

## 2016-11-05 DIAGNOSIS — F411 Generalized anxiety disorder: Secondary | ICD-10-CM

## 2016-11-05 MED ORDER — SERTRALINE HCL 50 MG PO TABS: 50.0000 mg | ORAL_TABLET | Freq: Every day | ORAL | 1 refills | Status: DC

## 2016-11-05 NOTE — Telephone Encounter (Signed)
°  Relation to [id number] Call back number:[phone] Pharmacy:[id number] [address]  Reason for call: pt is needing rx  sertraline (ZOLOFT) 50 MG tablet, pt states he has moved and is [facility] [place], [place].

## 2016-11-05 NOTE — Telephone Encounter (Signed)
Refille request for sertraline sent to pharmacy per patient request. TL/CMA

## 2017-01-17 ENCOUNTER — Ambulatory Visit (INDEPENDENT_AMBULATORY_CARE_PROVIDER_SITE_OTHER): Payer: BLUE CROSS/BLUE SHIELD | Admitting: Family Medicine

## 2017-01-17 VITALS — BP 114/78 | HR 72 | Temp 98.0°F | Resp 14 | Ht 71.0 in | Wt 183.0 lb

## 2017-01-17 DIAGNOSIS — F411 Generalized anxiety disorder: Secondary | ICD-10-CM

## 2017-01-17 NOTE — Patient Instructions (Signed)
If you decide you want to try to come off of the medication, let me know.  Keep up the great work.  Give me 1 week notice when you need refills.

## 2017-01-17 NOTE — Progress Notes (Signed)
Chief Complaint  Patient presents with  . Follow-up    4  mos recheck anxiety    Subjective Xavier Gonzalez is an 51 y.o. male who presents with anxiety f/u.  Anxiety symptoms: not currently experiencing symptoms, used to be irritable and have pooer sleep. Depressive symptoms none. Social stressors include work He is currently being treated with Zoloft 50 mg daily and has failed None. He is not following with a psychologist. No SI or HI. No self medication.  Past Medical History:  Diagnosis Date  . GAD (generalized anxiety disorder)   . Gilbert's disease   . History of chicken pox     Medications Current Outpatient Prescriptions on File Prior to Visit  Medication Sig Dispense Refill  . L-Lysine 500 MG TABS Take 1,000 mg by mouth daily.    . sertraline (ZOLOFT) 50 MG tablet Take 1 tablet (50 mg total) by mouth daily. 90 tablet 1   No current facility-administered medications on file prior to visit.    Allergies No Known Allergies Family History Family History  Problem Relation Age of Onset  . Diabetes Mother   . COPD Mother   . Hypertension Father   . Anxiety disorder Daughter   . Diabetes Maternal Grandmother   . COPD Maternal Grandmother   . Cancer Maternal Uncle      Review Of Systems Constitutional:  no unexplained fevers, sweats, or chills Cardiovascular:  no chest pain, no palpitations Gastrointestinal:  no nausea, vomiting, diarrhea, or constipation Psychiatric: as noted in HPI  Exam BP 114/78 (BP Location: Left Arm, Patient Position: Sitting, Cuff Size: Normal)   Pulse 72   Temp 98 F (36.7 C) (Oral)   Resp 14   Ht 5\' 11"  (1.803 m)   Wt 183 lb (83 kg)   BMI 25.52 kg/m  General:  well developed, well nourished, in no apparent distress Lungs:  clear to auscultation, breath sounds equal bilaterally, normal respiratory effort without accessory muscle use Cardio:  regular rate and rhythm without murmurs Psych: well oriented with normal range of affect  and age-appropriate judgement/insight  Assessment and Plan  Generalized anxiety disorder    Status: Controlled Cont current Zoloft 50 mg daily. Let us know if he would like to try to come off. Refills will be needed in Aug, give 1 week notice. F/u in 6 mo or prn. Pt voiced understanding and agreement to the plan.  [place], DO 01/17/17 7:23 AM

## 2017-05-01 ENCOUNTER — Other Ambulatory Visit: Payer: Self-pay | Admitting: Family Medicine

## 2017-05-01 DIAGNOSIS — F411 Generalized anxiety disorder: Secondary | ICD-10-CM

## 2017-07-21 ENCOUNTER — Ambulatory Visit: Payer: BLUE CROSS/BLUE SHIELD | Admitting: Family Medicine

## 2017-07-24 ENCOUNTER — Ambulatory Visit: Payer: BLUE CROSS/BLUE SHIELD | Admitting: Family Medicine

## 2017-07-24 ENCOUNTER — Encounter: Payer: Self-pay | Admitting: Family Medicine

## 2017-07-24 VITALS — BP 110/68 | HR 93 | Temp 98.5°F | Ht 72.0 in | Wt 188.5 lb

## 2017-07-24 DIAGNOSIS — F411 Generalized anxiety disorder: Secondary | ICD-10-CM

## 2017-07-24 NOTE — Progress Notes (Signed)
Pre visit review using our clinic review tool, if applicable. No additional management support is needed unless otherwise documented below in the visit note. 

## 2017-07-24 NOTE — Patient Instructions (Addendum)
Take 1/2 tab of Zoloft until you run out and then stop. Let me know if anything changes.   Keep up the great work.

## 2017-07-24 NOTE — Progress Notes (Signed)
Chief Complaint  Patient presents with  . Follow-up    Subjective: Patient is a 51 y.o. male here for f/u anxiety.  Doing well on Zoloft, symptoms controlled. Compliant. ED with meds though. We had discussed coming off of med and he is thinking of doing that. No SI or HI. He does not follow with a counselor.   ROS: Psych: No HI or SI  Past Medical History:  Diagnosis Date  . GAD (generalized anxiety disorder)   . Gilbert's disease   . History of chicken pox    No Known Allergies  Current Outpatient Medications:  .  L-Lysine 500 MG TABS, Take 1,000 mg by mouth daily., Disp: , Rfl:   Objective: BP 110/68 (BP Location: Left Arm, Patient Position: Sitting, Cuff Size: Large)   Pulse 93   Temp 98.5 F (36.9 C) (Oral)   Ht 6' (1.829 m)   Wt 188 lb 8 oz (85.5 kg)   SpO2 95%   BMI 25.57 kg/m  General: Awake, appears stated age Heart: RRR, no LE edema Lungs: CTAB, no rales, wheezes or rhonchi. No accessory muscle use Psych: Age appropriate judgment and insight, normal affect and mood  Assessment and Plan: GAD (generalized anxiety disorder)  Take 1/2 tab of Zoloft until he runs out and then stop. Let me know if there are issues and we can add it back. I will plan to see him for a physical in Aug after he turns 17.  The patient voiced understanding and agreement to the plan.  [place], DO 07/24/17  7:24 AM

## 2017-09-03 ENCOUNTER — Telehealth: Payer: Self-pay | Admitting: Family Medicine

## 2017-09-03 MED ORDER — FLUOXETINE HCL 20 MG PO TABS: 20.0000 mg | ORAL_TABLET | Freq: Every day | ORAL | 3 refills | Status: DC

## 2017-09-03 NOTE — Telephone Encounter (Signed)
Let's try Prozac. I will call it in. TY.

## 2017-09-03 NOTE — Telephone Encounter (Signed)
Copied from [place] [phone] [id number]. Topic: Quick Communication - See Telephone Encounter >> Sep 03, 2017  9:03 AM Bea Graff, NT wrote: CRM for notification. See Telephone encounter for: Xavier Gonzalez, wife, calling and states that the pt is not doing well coming off Zoloft. States his anger issues are going back to the way they were before he started the medication. Wants to know if pt needs to stay on 50mg  of Zoloft? Please call wife.   09/03/17.

## 2017-09-03 NOTE — Telephone Encounter (Signed)
Updated list and wife informed. The patients wife did want PCP to know that the 25 mg had helped his ED--but now going on 50 would be a problem again. Is there another alternative or do they all cause problems with ED?

## 2017-09-03 NOTE — Addendum Note (Signed)
Addended by: Ames Coupe on: 09/03/2017 05:15 PM   Modules accepted: Orders

## 2017-09-03 NOTE — Telephone Encounter (Signed)
I would rec going back to the 50 mg Zoloft if issues are recurring. Please update med list and call in refills if he needs them. TY.

## 2017-09-29 ENCOUNTER — Other Ambulatory Visit: Payer: Self-pay | Admitting: Family Medicine

## 2017-09-29 MED ORDER — FLUOXETINE HCL 20 MG PO TABS: 20.0000 mg | ORAL_TABLET | Freq: Every day | ORAL | 0 refills | Status: DC

## 2017-10-24 ENCOUNTER — Other Ambulatory Visit: Payer: Self-pay | Admitting: Family Medicine

## 2017-10-24 DIAGNOSIS — F411 Generalized anxiety disorder: Secondary | ICD-10-CM

## 2017-12-21 ENCOUNTER — Other Ambulatory Visit: Payer: Self-pay | Admitting: Family Medicine

## 2018-04-10 ENCOUNTER — Other Ambulatory Visit: Payer: Self-pay | Admitting: Family Medicine

## 2018-04-30 ENCOUNTER — Ambulatory Visit (INDEPENDENT_AMBULATORY_CARE_PROVIDER_SITE_OTHER): Payer: BLUE CROSS/BLUE SHIELD | Admitting: Family Medicine

## 2018-04-30 ENCOUNTER — Encounter: Payer: Self-pay | Admitting: Gastroenterology

## 2018-04-30 ENCOUNTER — Encounter: Payer: Self-pay | Admitting: Family Medicine

## 2018-04-30 VITALS — BP 104/68 | HR 77 | Temp 98.2°F | Ht 72.0 in | Wt 188.4 lb

## 2018-04-30 DIAGNOSIS — Z0000 Encounter for general adult medical examination without abnormal findings: Secondary | ICD-10-CM | POA: Diagnosis not present

## 2018-04-30 DIAGNOSIS — Z1211 Encounter for screening for malignant neoplasm of colon: Secondary | ICD-10-CM

## 2018-04-30 LAB — COMPREHENSIVE METABOLIC PANEL
ALBUMIN: 4.2 g/dL (ref 3.5–5.2)
ALT: 25 U/L (ref 0–53)
AST: 17 U/L (ref 0–37)
Alkaline Phosphatase: 86 U/L (ref 39–117)
BILIRUBIN TOTAL: 1.3 mg/dL — AB (ref 0.2–1.2)
BUN: 15 mg/dL (ref 6–23)
CALCIUM: 9.2 mg/dL (ref 8.4–10.5)
CO2: 30 meq/L (ref 19–32)
CREATININE: 0.98 mg/dL (ref 0.40–1.50)
Chloride: 106 mEq/L (ref 96–112)
GFR: 86.02 mL/min (ref >60.00)
Glucose, Bld: 114 mg/dL — ABNORMAL HIGH (ref 70–99)
Potassium: 3.8 mEq/L (ref 3.5–5.1)
Sodium: 142 mEq/L (ref 135–145)
TOTAL PROTEIN: 6.8 g/dL (ref 6.0–8.3)

## 2018-04-30 LAB — LIPID PANEL
CHOL/HDL RATIO: 5
CHOLESTEROL: 192 mg/dL (ref 0–200)
HDL: 37.5 mg/dL — AB (ref >39.00)
LDL Cholesterol: 128 mg/dL — ABNORMAL HIGH (ref 0–99)
NonHDL: 154.54
TRIGLYCERIDES: 132 mg/dL (ref 0.0–149.0)
VLDL: 26.4 mg/dL (ref 0.0–40.0)

## 2018-04-30 NOTE — Progress Notes (Signed)
Pre visit review using our clinic review tool, if applicable. No additional management support is needed unless otherwise documented below in the visit note. 

## 2018-04-30 NOTE — Progress Notes (Signed)
Chief Complaint  Patient presents with  . Follow-up    Well Male Xavier Gonzalez is here for a complete physical.   His last physical was >1 year ago.  Current diet: in general, a "healthy" diet.  Current exercise: golfing, walking through working Weight trend: stable Does pt snore? No. Daytime fatigue? No.  Seat belt? Yes.    Health maintenance Shingrix- No Colonoscopy- No Tetanus- Yes HIV- Yes Prostate cancer screening- No   Past Medical History:  Diagnosis Date  . GAD (generalized anxiety disorder)   . Gilbert's disease   . History of chicken pox       Past Surgical History:  Procedure Laterality Date  . FINGER SURGERY  08/2003  . HERNIA REPAIR    . TONSILLECTOMY AND ADENOIDECTOMY Bilateral     Medications  Current Outpatient Medications on File Prior to Visit  Medication Sig Dispense Refill  . L-Lysine 500 MG TABS Take 1,000 mg by mouth daily.    Marland Kitchen FLUoxetine (PROZAC) 10 MG tablet Take 1 tablet (10 mg total) by mouth daily. 90 tablet 1   Allergies No Known Allergies  Family History Family History  Problem Relation Age of Onset  . Diabetes Mother   . COPD Mother   . Hypertension Father   . Anxiety disorder Daughter   . Diabetes Maternal Grandmother   . COPD Maternal Grandmother   . Cancer Maternal Uncle     Review of Systems: Constitutional:  no fevers Eye:  no recent significant change in vision Ear/Nose/Mouth/Throat:  Ears:  no hearing loss Nose/Mouth/Throat:  no complaints of nasal congestion, no sore throat Cardiovascular:  no chest pain, no palpitations Respiratory:  no cough and no shortness of breath Gastrointestinal:  no abdominal pain, no change in bowel habits GU:  Male: negative for dysuria, frequency, and incontinence and negative for prostate symptoms Musculoskeletal/Extremities:  no pain, redness, or swelling of the joints Integumentary (Skin/Breast):  no abnormal skin lesions reported Neurologic:  no headaches Endocrine: No  unexpected weight changes Hematologic/Lymphatic:  no abnormal bleeding  Exam BP 104/68 (BP Location: Left Arm, Patient Position: Sitting, Cuff Size: Normal)   Pulse 77   Temp 98.2 F (36.8 C) (Oral)   Ht 6' (1.829 m)   Wt 188 lb 6 oz (85.4 kg)   SpO2 95%   BMI 25.55 kg/m  General:  well developed, well nourished, in no apparent distress Skin:  no significant moles, warts, or growths Head:  no masses, lesions, or tenderness Eyes:  pupils equal and round, sclera anicteric without injection Ears:  canals without lesions, TMs shiny without retraction, no obvious effusion, no erythema Nose:  nares patent, septum midline, mucosa normal Throat/Pharynx:  lips and gingiva without lesion; tongue and uvula midline; non-inflamed pharynx; no exudates or postnasal drainage Neck: neck supple without adenopathy, thyromegaly, or masses Lungs:  clear to auscultation, breath sounds equal bilaterally, no respiratory distress Cardio:  regular rate and rhythm, no LE edema, no bruits Abdomen:  abdomen soft, nontender; bowel sounds normal; no masses or organomegaly Genital (male): circumcised penis, no lesions or discharge; testes present bilaterally without masses or tenderness Rectal: Deferred Musculoskeletal:  symmetrical muscle groups noted without atrophy or deformity Extremities:  no clubbing, cyanosis, or edema, no deformities, no skin discoloration Neuro:  gait normal; deep tendon reflexes normal and symmetric Psych: well oriented with normal range of affect and appropriate judgment/insight  Assessment and Plan  Well adult exam - Plan: Comprehensive metabolic panel, Lipid panel  Screen for colon cancer -  Plan: Ambulatory referral to Gastroenterology   Well 52 y.o. male. Counseled on diet and exercise. Counseled on risks and benefits of prostate cancer screening with PSA. The patient agrees to forego testing. Immunizations, labs, and further orders as above. Follow up in 6 mo. The patient  voiced understanding and agreement to the plan.  [place], DO 04/30/18 7:21 AM

## 2018-04-30 NOTE — Addendum Note (Signed)
Addended by: Hinton Dyer on: 04/30/2018 03:12 PM   Modules accepted: Orders

## 2018-04-30 NOTE — Patient Instructions (Addendum)
Give [place] 2-3 business days to get the results of your labs back.   Keep the diet clean. Stay active. Consider yoga or lifting weights.  Think about the shingles vaccine. Call for a nurse appointment. This will make you feel low energy and achy for the next 48 hours after getting it.  Let us know if you need anything.

## 2018-05-01 ENCOUNTER — Other Ambulatory Visit: Payer: Self-pay | Admitting: Family Medicine

## 2018-05-01 ENCOUNTER — Other Ambulatory Visit (INDEPENDENT_AMBULATORY_CARE_PROVIDER_SITE_OTHER): Payer: BLUE CROSS/BLUE SHIELD

## 2018-05-01 DIAGNOSIS — R739 Hyperglycemia, unspecified: Secondary | ICD-10-CM

## 2018-05-01 DIAGNOSIS — Z0000 Encounter for general adult medical examination without abnormal findings: Secondary | ICD-10-CM | POA: Diagnosis not present

## 2018-05-01 LAB — HEMOGLOBIN A1C: HEMOGLOBIN A1C: 5.5 % (ref 4.6–6.5)

## 2018-05-01 NOTE — Addendum Note (Signed)
Addended by: Caffie Pinto on: 05/01/2018 08:43 AM   Modules accepted: Orders

## 2018-06-03 ENCOUNTER — Encounter: Payer: Self-pay | Admitting: Gastroenterology

## 2018-06-03 ENCOUNTER — Ambulatory Visit (AMBULATORY_SURGERY_CENTER): Payer: Self-pay | Admitting: *Deleted

## 2018-06-03 VITALS — Ht 72.0 in | Wt 186.0 lb

## 2018-06-03 DIAGNOSIS — Z1211 Encounter for screening for malignant neoplasm of colon: Secondary | ICD-10-CM

## 2018-06-03 MED ORDER — NA SULFATE-K SULFATE-MG SULF 17.5-3.13-1.6 GM/177ML PO SOLN: ORAL | 0 refills | Status: DC

## 2018-06-03 NOTE — Progress Notes (Signed)
Patient denies any allergies to eggs or soy. Patient denies any problems with anesthesia/sedation. Patient denies any oxygen use at home. Patient denies taking any diet/weight loss medications or blood thinners. EMMI education offered, pt declined. Suprep coupon $15 off given to pt.  

## 2018-06-17 ENCOUNTER — Encounter: Payer: Self-pay | Admitting: Gastroenterology

## 2018-06-17 ENCOUNTER — Ambulatory Visit (AMBULATORY_SURGERY_CENTER): Payer: BLUE CROSS/BLUE SHIELD | Admitting: Gastroenterology

## 2018-06-17 VITALS — BP 116/75 | HR 66 | Temp 97.8°F | Resp 15 | Ht 72.0 in | Wt 186.0 lb

## 2018-06-17 DIAGNOSIS — Z1211 Encounter for screening for malignant neoplasm of colon: Secondary | ICD-10-CM | POA: Diagnosis not present

## 2018-06-17 DIAGNOSIS — D125 Benign neoplasm of sigmoid colon: Secondary | ICD-10-CM

## 2018-06-17 DIAGNOSIS — K640 First degree hemorrhoids: Secondary | ICD-10-CM

## 2018-06-17 DIAGNOSIS — K635 Polyp of colon: Secondary | ICD-10-CM

## 2018-06-17 DIAGNOSIS — D123 Benign neoplasm of transverse colon: Secondary | ICD-10-CM

## 2018-06-17 MED ORDER — SODIUM CHLORIDE 0.9 % IV SOLN: 500.0000 mL | INTRAVENOUS | Status: DC

## 2018-06-17 NOTE — Progress Notes (Signed)
Called to room to assist during endoscopic procedure.  Patient ID and intended procedure confirmed with present staff. Received instructions for my participation in the procedure from the performing physician.  

## 2018-06-17 NOTE — Progress Notes (Signed)
Report given to PACU, vss 

## 2018-06-17 NOTE — Patient Instructions (Signed)
YOU HAD AN ENDOSCOPIC PROCEDURE TODAY AT THE [facility]:   Refer to the procedure report that was given to you for any specific questions about what was found during the examination.  If the procedure report does not answer your questions, please call your gastroenterologist to clarify.  If you requested that your care partner not be given the details of your procedure findings, then the procedure report has been included in a sealed envelope for you to review at your convenience later.  YOU SHOULD EXPECT: Some feelings of bloating in the abdomen. Passage of more gas than usual.  Walking can help get rid of the air that was put into your GI tract during the procedure and reduce the bloating. If you had a lower endoscopy (such as a colonoscopy or flexible sigmoidoscopy) you may notice spotting of blood in your stool or on the toilet paper. If you underwent a bowel prep for your procedure, you may not have a normal bowel movement for a few days.  Please Note:  You might notice some irritation and congestion in your nose or some drainage.  This is from the oxygen used during your procedure.  There is no need for concern and it should clear up in a day or so.  SYMPTOMS TO REPORT IMMEDIATELY:   Following lower endoscopy (colonoscopy or flexible sigmoidoscopy):  Excessive amounts of blood in the stool  Significant tenderness or worsening of abdominal pains  Swelling of the abdomen that is new, acute  Fever of 100F or higher   For urgent or emergent issues, a gastroenterologist can be reached at any hour by calling [phone].   DIET:  We do recommend a small meal at first, but then you may proceed to your regular diet.  Drink plenty of fluids but you should avoid alcoholic beverages for 24 hours.  ACTIVITY:  You should plan to take it easy for the rest of today and you should NOT DRIVE or use heavy machinery until tomorrow (because of the sedation medicines used during the test).     FOLLOW UP: Our staff will call the number listed on your records the next business day following your procedure to check on you and address any questions or concerns that you may have regarding the information given to you following your procedure. If we do not reach you, we will leave a message.  However, if you are feeling well and you are not experiencing any problems, there is no need to return our call.  We will assume that you have returned to your regular daily activities without incident.  If any biopsies were taken you will be contacted by phone or by letter within the next 1-3 weeks.  Please call us at [phone] if you have not heard about the biopsies in 3 weeks.    SIGNATURES/CONFIDENTIALITY: You and/or your care partner have signed paperwork which will be entered into your electronic medical record.  These signatures attest to the fact that that the information above on your After Visit Summary has been reviewed and is understood.  Full responsibility of the confidentiality of this discharge information lies with you and/or your care-partner.  Read all handouts given to you by your recovery room nurse. 

## 2018-06-17 NOTE — Op Note (Signed)
[place] Patient Name: Xavier Gonzalez Procedure Date: 06/17/2018 8:36 AM MRN: [id number] Endoscopist: Gerrit Heck , MD Age: 52 Referring MD:  Date of Birth: 04-05-1966 Gender: Male Account #: [id number] Procedure:                Colonoscopy Indications:              Screening for colorectal malignant neoplasm, This                            is the patient's first colonoscopy Medicines:                Monitored Anesthesia Care Procedure:                Pre-Anesthesia Assessment:                           - Prior to the procedure, a History and Physical                            was performed, and patient medications and                            allergies were reviewed. The patient's tolerance of                            previous anesthesia was also reviewed. The risks                            and benefits of the procedure and the sedation                            options and risks were discussed with the patient.                            All questions were answered, and informed consent                            was obtained. Prior Anticoagulants: The patient has                            taken no previous anticoagulant or antiplatelet                            agents. ASA Grade Assessment: II - A patient with                            mild systemic disease. After reviewing the risks                            and benefits, the patient was deemed in                            satisfactory condition to undergo the procedure.  After obtaining informed consent, the colonoscope                            was passed under direct vision. Throughout the                            procedure, the patient's blood pressure, pulse, and                            oxygen saturations were monitored continuously. The                            Colonoscope was introduced through the anus and                            advanced to the the terminal  ileum. The colonoscopy                            was performed without difficulty. The patient                            tolerated the procedure well. The quality of the                            bowel preparation was adequate. Scope In: 8:51:06 AM Scope Out: 9:10:48 AM Scope Withdrawal Time: 0 hours 17 minutes 4 seconds  Total Procedure Duration: 0 hours 19 minutes 42 seconds  Findings:                 The perianal and digital rectal examinations were                            normal.                           Two sessile polyps were found in the transverse                            colon. The polyps were 2 to 3 mm in size. These                            polyps were removed with a cold snare. Resection                            and retrieval were complete. Estimated blood loss                            was minimal.                           A 2 mm polyp was found in the sigmoid colon. The                            polyp was sessile. The polyp was removed with a  cold biopsy forceps. Resection and retrieval were                            complete. Estimated blood loss was minimal.                           Retroflexion in the right colon was performed.                           Non-bleeding internal hemorrhoids were found during                            retroflexion. The hemorrhoids were small.                           The terminal ileum appeared normal. Complications:            No immediate complications. Estimated Blood Loss:     Estimated blood loss was minimal. Impression:               - Two 2 to 3 mm polyps in the transverse colon,                            removed with a cold snare. Resected and retrieved.                           - One 2 mm polyp in the sigmoid colon, removed with                            a cold biopsy forceps. Resected and retrieved.                           - Non-bleeding internal hemorrhoids.                            - The examined portion of the ileum was normal. Recommendation:           - Patient has a contact number available for                            emergencies. The signs and symptoms of potential                            delayed complications were discussed with the                            patient. Return to normal activities tomorrow.                            Written discharge instructions were provided to the                            patient.                           -  Resume previous diet today.                           - Continue present medications.                           - Await pathology results.                           - Repeat colonoscopy in 5-10 years for surveillance                            based on pathology results.                           - Return to GI office PRN. Gerrit Heck, MD 06/17/2018 9:17:15 AM

## 2018-06-18 ENCOUNTER — Telehealth: Payer: Self-pay

## 2018-06-18 NOTE — Telephone Encounter (Signed)
  Follow up Call-  Call back number 06/17/2018  Post procedure Call Back phone  # [phone]  Permission to leave phone message Yes  Some recent data might be hidden     Patient questions:  Do you have a fever, pain , or abdominal swelling? No. Pain Score  0 *  Have you tolerated food without any problems? Yes.    Have you been able to return to your normal activities? Yes.    Do you have any questions about your discharge instructions: Diet   No. Medications  No. Follow up visit  No.  Do you have questions or concerns about your Care? No.  Actions: * If pain score is 4 or above: No action needed, pain <4.

## 2018-06-24 ENCOUNTER — Encounter: Payer: Self-pay | Admitting: Gastroenterology

## 2018-07-10 ENCOUNTER — Other Ambulatory Visit: Payer: Self-pay | Admitting: Family Medicine

## 2018-11-02 ENCOUNTER — Encounter: Payer: Self-pay | Admitting: Family Medicine

## 2018-11-02 ENCOUNTER — Ambulatory Visit: Payer: BLUE CROSS/BLUE SHIELD | Admitting: Family Medicine

## 2018-11-02 VITALS — BP 110/72 | HR 61 | Temp 97.9°F | Ht 72.0 in | Wt 189.0 lb

## 2018-11-02 DIAGNOSIS — F411 Generalized anxiety disorder: Secondary | ICD-10-CM

## 2018-11-02 DIAGNOSIS — Z23 Encounter for immunization: Secondary | ICD-10-CM | POA: Diagnosis not present

## 2018-11-02 NOTE — Progress Notes (Signed)
Chief Complaint  Patient presents with  . Follow-up    Subjective: Patient is a 53 y.o. male here for f/u GAD.  Currently on Prozac 10 mg/d. Feels that he is doing well. No AE's. No SI or HI.   ROS: Psych: As noted in HPI  Past Medical History:  Diagnosis Date  . GAD (generalized anxiety disorder)   . Gilbert's disease   . History of chicken pox    Objective: BP 110/72 (BP Location: Left Arm, Patient Position: Sitting, Cuff Size: Normal)   Pulse 61   Temp 97.9 F (36.6 C) (Oral)   Ht 6' (1.829 m)   Wt 189 lb (85.7 kg)   SpO2 96%   BMI 25.63 kg/m  General: Awake, appears stated age Heart: RRR, no murmurs Lungs: CTAB, no rales, wheezes or rhonchi. No accessory muscle use Psych: Age appropriate judgment and insight, normal affect and mood  Assessment and Plan: Generalized anxiety disorder  Cont Prozac at current dose.  F/u in 6 mo for CPE or prn. The patient voiced understanding and agreement to the plan.  [place], DO 11/02/18  7:12 AM

## 2018-11-02 NOTE — Patient Instructions (Signed)
Let me know if you want to change your medication.   Keep the diet clean and stay active.  Let us know if you need anything.

## 2018-11-02 NOTE — Addendum Note (Signed)
Addended by: Sharon Seller B on: 11/02/2018 07:25 AM   Modules accepted: Orders

## 2018-11-19 ENCOUNTER — Emergency Department (HOSPITAL_BASED_OUTPATIENT_CLINIC_OR_DEPARTMENT_OTHER): Payer: BLUE CROSS/BLUE SHIELD

## 2018-11-19 ENCOUNTER — Ambulatory Visit: Payer: Self-pay | Admitting: Family Medicine

## 2018-11-19 ENCOUNTER — Encounter (HOSPITAL_BASED_OUTPATIENT_CLINIC_OR_DEPARTMENT_OTHER): Payer: Self-pay | Admitting: Emergency Medicine

## 2018-11-19 ENCOUNTER — Encounter (HOSPITAL_COMMUNITY)
Admission: EM | Disposition: A | Discharge status: Home / Self Care | Payer: Self-pay | Source: Home / Self Care | Attending: Emergency Medicine

## 2018-11-19 ENCOUNTER — Observation Stay (HOSPITAL_BASED_OUTPATIENT_CLINIC_OR_DEPARTMENT_OTHER)
Admission: EM | Admit: 2018-11-19 | Discharge: 2018-11-20 | Disposition: A | Discharge status: Home / Self Care | Payer: BLUE CROSS/BLUE SHIELD | Attending: General Surgery | Admitting: General Surgery

## 2018-11-19 ENCOUNTER — Observation Stay (HOSPITAL_COMMUNITY): Payer: BLUE CROSS/BLUE SHIELD | Admitting: Anesthesiology

## 2018-11-19 ENCOUNTER — Encounter: Payer: Self-pay | Admitting: Family Medicine

## 2018-11-19 ENCOUNTER — Other Ambulatory Visit: Payer: Self-pay

## 2018-11-19 ENCOUNTER — Ambulatory Visit: Payer: BLUE CROSS/BLUE SHIELD | Admitting: Family Medicine

## 2018-11-19 VITALS — BP 112/72 | HR 95 | Temp 98.2°F | Ht 72.0 in | Wt 186.4 lb

## 2018-11-19 DIAGNOSIS — Z87891 Personal history of nicotine dependence: Secondary | ICD-10-CM | POA: Insufficient documentation

## 2018-11-19 DIAGNOSIS — F411 Generalized anxiety disorder: Secondary | ICD-10-CM | POA: Diagnosis not present

## 2018-11-19 DIAGNOSIS — R1031 Right lower quadrant pain: Secondary | ICD-10-CM

## 2018-11-19 DIAGNOSIS — K3580 Unspecified acute appendicitis: Secondary | ICD-10-CM | POA: Diagnosis not present

## 2018-11-19 DIAGNOSIS — Z79899 Other long term (current) drug therapy: Secondary | ICD-10-CM | POA: Diagnosis not present

## 2018-11-19 DIAGNOSIS — Z8249 Family history of ischemic heart disease and other diseases of the circulatory system: Secondary | ICD-10-CM | POA: Insufficient documentation

## 2018-11-19 HISTORY — PX: LAPAROSCOPIC APPENDECTOMY: SHX408

## 2018-11-19 LAB — CBC WITH DIFFERENTIAL/PLATELET
Abs Immature Granulocytes: 0.02 10*3/uL (ref 0.00–0.07)
Basophils Absolute: 0.1 10*3/uL (ref 0.0–0.1)
Basophils Relative: 1 %
Eosinophils Absolute: 0 10*3/uL (ref 0.0–0.5)
Eosinophils Relative: 0 %
HCT: 48.3 % (ref 39.0–52.0)
Hemoglobin: 16.6 g/dL (ref 13.0–17.0)
Immature Granulocytes: 0 %
Lymphocytes Relative: 10 %
Lymphs Abs: 0.5 10*3/uL — ABNORMAL LOW (ref 0.7–4.0)
MCH: 31 pg (ref 26.0–34.0)
MCHC: 34.4 g/dL (ref 30.0–36.0)
MCV: 90.1 fL (ref 80.0–100.0)
Monocytes Absolute: 0.6 10*3/uL (ref 0.1–1.0)
Monocytes Relative: 10 %
Neutro Abs: 4.5 10*3/uL (ref 1.7–7.7)
Neutrophils Relative %: 79 %
Platelets: 189 10*3/uL (ref 150–400)
RBC: 5.36 MIL/uL (ref 4.22–5.81)
RDW: 12.1 % (ref 11.5–15.5)
WBC: 5.7 10*3/uL (ref 4.0–10.5)
nRBC: 0 % (ref 0.0–0.2)

## 2018-11-19 LAB — COMPREHENSIVE METABOLIC PANEL
ALT: 30 U/L (ref 0–44)
AST: 25 U/L (ref 15–41)
Albumin: 4.1 g/dL (ref 3.5–5.0)
Alkaline Phosphatase: 88 U/L (ref 38–126)
Anion gap: 11 (ref 5–15)
BILIRUBIN TOTAL: 2.7 mg/dL — AB (ref 0.3–1.2)
BUN: 15 mg/dL (ref 6–20)
CO2: 23 mmol/L (ref 22–32)
Calcium: 8.8 mg/dL — ABNORMAL LOW (ref 8.9–10.3)
Chloride: 104 mmol/L (ref 98–111)
Creatinine, Ser: 1.03 mg/dL (ref 0.61–1.24)
GFR calc non Af Amer: >60 mL/min (ref >60)
Glucose, Bld: 115 mg/dL — ABNORMAL HIGH (ref 70–99)
Potassium: 3.5 mmol/L (ref 3.5–5.1)
Sodium: 138 mmol/L (ref 135–145)
Total Protein: 7.2 g/dL (ref 6.5–8.1)

## 2018-11-19 SURGERY — APPENDECTOMY, LAPAROSCOPIC
Anesthesia: General | Site: Abdomen

## 2018-11-19 MED ORDER — MIDAZOLAM HCL 2 MG/2ML IJ SOLN
INTRAMUSCULAR | Status: AC
  Filled 2018-11-19: qty 2

## 2018-11-19 MED ORDER — METOPROLOL TARTRATE 5 MG/5ML IV SOLN: 5.0000 mg | Freq: Four times a day (QID) | INTRAVENOUS | Status: DC | PRN

## 2018-11-19 MED ORDER — METHOCARBAMOL 500 MG PO TABS
500.0000 mg | ORAL_TABLET | Freq: Four times a day (QID) | ORAL | Status: DC | PRN
  Administered 2018-11-19 – 2018-11-20 (×2): 500 mg via ORAL
  Filled 2018-11-19 (×2): qty 1

## 2018-11-19 MED ORDER — FENTANYL CITRATE (PF) 100 MCG/2ML IJ SOLN: 25.0000 ug | INTRAMUSCULAR | Status: DC | PRN

## 2018-11-19 MED ORDER — ACETAMINOPHEN 500 MG PO TABS: 1000.0000 mg | ORAL_TABLET | Freq: Once | ORAL | Status: DC | PRN

## 2018-11-19 MED ORDER — POLYETHYLENE GLYCOL 3350 17 G PO PACK: 17.0000 g | PACK | Freq: Every day | ORAL | Status: DC | PRN

## 2018-11-19 MED ORDER — ACETAMINOPHEN 500 MG PO TABS
1000.0000 mg | ORAL_TABLET | Freq: Four times a day (QID) | ORAL | Status: DC
  Administered 2018-11-19 – 2018-11-20 (×2): 1000 mg via ORAL
  Filled 2018-11-19 (×2): qty 2

## 2018-11-19 MED ORDER — LIDOCAINE 2% (20 MG/ML) 5 ML SYRINGE
INTRAMUSCULAR | Status: AC
  Filled 2018-11-19: qty 5

## 2018-11-19 MED ORDER — ONDANSETRON HCL 4 MG/2ML IJ SOLN: 4.0000 mg | Freq: Four times a day (QID) | INTRAMUSCULAR | Status: DC | PRN

## 2018-11-19 MED ORDER — KCL IN DEXTROSE-NACL 20-5-0.45 MEQ/L-%-% IV SOLN
INTRAVENOUS | Status: DC
  Administered 2018-11-19: 23:00:00 via INTRAVENOUS
  Filled 2018-11-19 (×3): qty 1000

## 2018-11-19 MED ORDER — PROPOFOL 10 MG/ML IV BOLUS
INTRAVENOUS | Status: DC | PRN
  Administered 2018-11-19: 160 mg via INTRAVENOUS

## 2018-11-19 MED ORDER — FENTANYL CITRATE (PF) 100 MCG/2ML IJ SOLN
INTRAMUSCULAR | Status: AC
  Filled 2018-11-19: qty 2

## 2018-11-19 MED ORDER — OXYCODONE HCL 5 MG PO TABS
5.0000 mg | ORAL_TABLET | ORAL | Status: DC | PRN
  Administered 2018-11-19: 5 mg via ORAL
  Filled 2018-11-19: qty 1

## 2018-11-19 MED ORDER — DIPHENHYDRAMINE HCL 25 MG PO CAPS: 25.0000 mg | ORAL_CAPSULE | Freq: Four times a day (QID) | ORAL | Status: DC | PRN

## 2018-11-19 MED ORDER — METRONIDAZOLE IN NACL 5-0.79 MG/ML-% IV SOLN
500.0000 mg | Freq: Once | INTRAVENOUS | Status: AC
  Administered 2018-11-19: 500 mg via INTRAVENOUS
  Filled 2018-11-19: qty 100

## 2018-11-19 MED ORDER — SIMETHICONE 80 MG PO CHEW
40.0000 mg | CHEWABLE_TABLET | Freq: Four times a day (QID) | ORAL | Status: DC | PRN
  Filled 2018-11-19: qty 1

## 2018-11-19 MED ORDER — DIPHENHYDRAMINE HCL 50 MG/ML IJ SOLN: 25.0000 mg | Freq: Four times a day (QID) | INTRAMUSCULAR | Status: DC | PRN

## 2018-11-19 MED ORDER — PROPOFOL 10 MG/ML IV BOLUS
INTRAVENOUS | Status: AC
  Filled 2018-11-19: qty 20

## 2018-11-19 MED ORDER — ZOLPIDEM TARTRATE 5 MG PO TABS: 5.0000 mg | ORAL_TABLET | Freq: Every evening | ORAL | Status: DC | PRN

## 2018-11-19 MED ORDER — ONDANSETRON 4 MG PO TBDP: 4.0000 mg | ORAL_TABLET | Freq: Four times a day (QID) | ORAL | Status: DC | PRN

## 2018-11-19 MED ORDER — IBUPROFEN 400 MG PO TABS
600.0000 mg | ORAL_TABLET | Freq: Three times a day (TID) | ORAL | Status: DC
  Administered 2018-11-19 – 2018-11-20 (×2): 600 mg via ORAL
  Filled 2018-11-19 (×2): qty 1

## 2018-11-19 MED ORDER — IOHEXOL 300 MG/ML  SOLN
100.0000 mL | Freq: Once | INTRAMUSCULAR | Status: AC | PRN
  Administered 2018-11-19: 100 mL via INTRAVENOUS

## 2018-11-19 MED ORDER — FLUOXETINE HCL 10 MG PO CAPS
10.0000 mg | ORAL_CAPSULE | Freq: Every day | ORAL | Status: DC
  Administered 2018-11-19: 10 mg via ORAL
  Filled 2018-11-19 (×2): qty 1

## 2018-11-19 MED ORDER — ONDANSETRON HCL 4 MG/2ML IJ SOLN
INTRAMUSCULAR | Status: DC | PRN
  Administered 2018-11-19: 4 mg via INTRAVENOUS

## 2018-11-19 MED ORDER — MIDAZOLAM HCL 5 MG/5ML IJ SOLN
INTRAMUSCULAR | Status: DC | PRN
  Administered 2018-11-19: 2 mg via INTRAVENOUS

## 2018-11-19 MED ORDER — DEXAMETHASONE SODIUM PHOSPHATE 10 MG/ML IJ SOLN
INTRAMUSCULAR | Status: DC | PRN
  Administered 2018-11-19: 8 mg via INTRAVENOUS

## 2018-11-19 MED ORDER — MORPHINE SULFATE (PF) 2 MG/ML IV SOLN: 1.0000 mg | INTRAVENOUS | Status: DC | PRN

## 2018-11-19 MED ORDER — ONDANSETRON HCL 4 MG/2ML IJ SOLN
INTRAMUSCULAR | Status: AC
  Filled 2018-11-19: qty 2

## 2018-11-19 MED ORDER — ACETAMINOPHEN 10 MG/ML IV SOLN: 1000.0000 mg | Freq: Once | INTRAVENOUS | Status: DC | PRN

## 2018-11-19 MED ORDER — ACETAMINOPHEN 160 MG/5ML PO SOLN: 1000.0000 mg | Freq: Once | ORAL | Status: DC | PRN

## 2018-11-19 MED ORDER — ROCURONIUM BROMIDE 10 MG/ML (PF) SYRINGE
PREFILLED_SYRINGE | INTRAVENOUS | Status: AC
  Filled 2018-11-19: qty 10

## 2018-11-19 MED ORDER — 0.9 % SODIUM CHLORIDE (POUR BTL) OPTIME
TOPICAL | Status: DC | PRN
  Administered 2018-11-19: 1000 mL

## 2018-11-19 MED ORDER — FENTANYL CITRATE (PF) 100 MCG/2ML IJ SOLN
INTRAMUSCULAR | Status: DC | PRN
  Administered 2018-11-19 (×4): 50 ug via INTRAVENOUS

## 2018-11-19 MED ORDER — BUPIVACAINE-EPINEPHRINE (PF) 0.25% -1:200000 IJ SOLN
INTRAMUSCULAR | Status: AC
  Filled 2018-11-19: qty 30

## 2018-11-19 MED ORDER — DEXAMETHASONE SODIUM PHOSPHATE 10 MG/ML IJ SOLN
INTRAMUSCULAR | Status: AC
  Filled 2018-11-19: qty 1

## 2018-11-19 MED ORDER — LACTATED RINGERS IR SOLN
Status: DC | PRN
  Administered 2018-11-19: 1000 mL

## 2018-11-19 MED ORDER — BUPIVACAINE-EPINEPHRINE 0.25% -1:200000 IJ SOLN
INTRAMUSCULAR | Status: DC | PRN
  Administered 2018-11-19: 24 mL

## 2018-11-19 MED ORDER — OXYCODONE HCL 5 MG/5ML PO SOLN: 5.0000 mg | Freq: Once | ORAL | Status: DC | PRN

## 2018-11-19 MED ORDER — ROCURONIUM BROMIDE 50 MG/5ML IV SOSY
PREFILLED_SYRINGE | INTRAVENOUS | Status: DC | PRN
  Administered 2018-11-19: 50 mg via INTRAVENOUS

## 2018-11-19 MED ORDER — ENOXAPARIN SODIUM 40 MG/0.4ML ~~LOC~~ SOLN: 40.0000 mg | SUBCUTANEOUS | Status: DC

## 2018-11-19 MED ORDER — SUGAMMADEX SODIUM 200 MG/2ML IV SOLN
INTRAVENOUS | Status: DC | PRN
  Administered 2018-11-19: 200 mg via INTRAVENOUS

## 2018-11-19 MED ORDER — OXYCODONE HCL 5 MG PO TABS: 5.0000 mg | ORAL_TABLET | Freq: Once | ORAL | Status: DC | PRN

## 2018-11-19 MED ORDER — LIDOCAINE 2% (20 MG/ML) 5 ML SYRINGE
INTRAMUSCULAR | Status: DC | PRN
  Administered 2018-11-19: 100 mg via INTRAVENOUS

## 2018-11-19 MED ORDER — LACTATED RINGERS IV SOLN
Freq: Once | INTRAVENOUS | Status: AC
  Administered 2018-11-19 (×2): via INTRAVENOUS

## 2018-11-19 MED ORDER — SUCCINYLCHOLINE CHLORIDE 200 MG/10ML IV SOSY
PREFILLED_SYRINGE | INTRAVENOUS | Status: DC | PRN
  Administered 2018-11-19: 120 mg via INTRAVENOUS

## 2018-11-19 MED ORDER — SODIUM CHLORIDE 0.9 % IV SOLN
2.0000 g | Freq: Once | INTRAVENOUS | Status: AC
  Administered 2018-11-19: 2 g via INTRAVENOUS
  Filled 2018-11-19: qty 20

## 2018-11-19 SURGICAL SUPPLY — 40 items
ADH SKN CLS APL DERMABOND .7 (GAUZE/BANDAGES/DRESSINGS) ×1
APPLIER CLIP 5 13 M/L LIGAMAX5 (MISCELLANEOUS) ×3
APPLIER CLIP ROT 10 11.4 M/L (STAPLE)
APR CLP MED LRG 11.4X10 (STAPLE)
APR CLP MED LRG 5 ANG JAW (MISCELLANEOUS) ×1
BAG SPEC RTRVL LRG 6X4 10 (ENDOMECHANICALS) ×1
CABLE HIGH FREQUENCY MONO STRZ (ELECTRODE) ×3 IMPLANT
CHLORAPREP W/TINT 26ML (MISCELLANEOUS) ×3 IMPLANT
CLIP APPLIE 5 13 M/L LIGAMAX5 (MISCELLANEOUS) IMPLANT
CLIP APPLIE ROT 10 11.4 M/L (STAPLE) IMPLANT
COVER WAND RF STERILE (DRAPES) ×2 IMPLANT
CUTTER FLEX LINEAR 45M (STAPLE) ×2 IMPLANT
DECANTER SPIKE VIAL GLASS SM (MISCELLANEOUS) ×3 IMPLANT
DERMABOND ADVANCED (GAUZE/BANDAGES/DRESSINGS) ×2
DERMABOND ADVANCED .7 DNX12 (GAUZE/BANDAGES/DRESSINGS) ×1 IMPLANT
ELECT REM PT RETURN 15FT ADLT (MISCELLANEOUS) ×3 IMPLANT
ENDOLOOP SUT PDS II  0 18 (SUTURE)
ENDOLOOP SUT PDS II 0 18 (SUTURE) IMPLANT
GLOVE BIO SURGEON STRL SZ7.5 (GLOVE) ×3 IMPLANT
GLOVE BIOGEL PI IND STRL 7.5 (GLOVE) IMPLANT
GLOVE BIOGEL PI INDICATOR 7.5 (GLOVE) ×4
GLOVE SURG SS PI 7.0 STRL IVOR (GLOVE) ×2 IMPLANT
GOWN STRL REUS W/TWL XL LVL3 (GOWN DISPOSABLE) ×6 IMPLANT
KIT BASIN OR (CUSTOM PROCEDURE TRAY) ×3 IMPLANT
KIT TURNOVER KIT A (KITS) ×2 IMPLANT
POUCH SPECIMEN RETRIEVAL 10MM (ENDOMECHANICALS) ×3 IMPLANT
RELOAD 45 VASCULAR/THIN (ENDOMECHANICALS) IMPLANT
RELOAD STAPLE 45 2.5 WHT GRN (ENDOMECHANICALS) IMPLANT
RELOAD STAPLE 45 3.5 BLU ETS (ENDOMECHANICALS) IMPLANT
RELOAD STAPLE TA45 3.5 REG BLU (ENDOMECHANICALS) ×6 IMPLANT
SCISSORS LAP 5X35 DISP (ENDOMECHANICALS) ×3 IMPLANT
SET IRRIG TUBING LAPAROSCOPIC (IRRIGATION / IRRIGATOR) ×3 IMPLANT
SET TUBE SMOKE EVAC HIGH FLOW (TUBING) ×3 IMPLANT
SHEARS HARMONIC ACE PLUS 36CM (ENDOMECHANICALS) ×3 IMPLANT
SUT MNCRL AB 4-0 PS2 18 (SUTURE) ×3 IMPLANT
TOWEL OR 17X26 10 PK STRL BLUE (TOWEL DISPOSABLE) ×3 IMPLANT
TRAY FOLEY MTR SLVR 16FR STAT (SET/KITS/TRAYS/PACK) ×2 IMPLANT
TRAY LAPAROSCOPIC (CUSTOM PROCEDURE TRAY) ×3 IMPLANT
TROCAR BLADELESS OPT 5 100 (ENDOMECHANICALS) ×3 IMPLANT
TROCAR XCEL BLUNT TIP 100MML (ENDOMECHANICALS) ×3 IMPLANT

## 2018-11-19 NOTE — ED Triage Notes (Signed)
Mid abd pain since yesterday. Sent from PCP to r/o appendicitis.

## 2018-11-19 NOTE — H&P (Signed)
Xavier Gonzalez is an 53 y.o. male.   Chief Complaint: abdominal pain with fever   HPI: Pt is a 53 y/o male who presented to the family practice center today with 2 days of increasing abdominal pain, and fever up to 101.4.  He was doubled over with pain on Tuesday.  He was sent from his PCP to the ED at [facility] today.  He is really feeling better right now.    Work up in the ED:  He is afebrile, BP slightly elevated.  Labs are normal except a bilirubin of 2.7.  CT shows an enlarged appendix 1.4 cm with appendicoliths, no free perforation or periappendiceal fluid collection.  Pt transferred to [facility] for evaluation  Past Medical History:  Diagnosis Date  . GAD (generalized anxiety disorder)   . Gilbert's disease   . History of chicken pox     Past Surgical History:  Procedure Laterality Date  . FINGER SURGERY  08/2003  . HERNIA REPAIR    . TONSILLECTOMY AND ADENOIDECTOMY Bilateral     Family History  Problem Relation Age of Onset  . Diabetes Mother   . COPD Mother   . Hypertension Father   . Anxiety disorder Daughter   . Diabetes Maternal Grandmother   . COPD Maternal Grandmother   . Cancer Maternal Uncle   . Colon cancer Neg Hx   . Colon polyps Neg Hx   . Esophageal cancer Neg Hx   . Rectal cancer Neg Hx   . Stomach cancer Neg Hx    Social History:  reports that he has quit smoking. He has never used smokeless tobacco. He reports current alcohol use of about 6.0 standard drinks of alcohol per week. He reports that he does not use drugs.  Allergies: No Known Allergies  Prior to Admission medications   Medication Sig Start Date End Date Taking? Authorizing Provider  FLUoxetine (PROZAC) 10 MG tablet Take 1 tablet (10 mg total) by mouth daily. 04/30/18   Shelda Pal, DO  L-Lysine 500 MG TABS Take 1,000 mg by mouth daily.    [provider]     Results for orders placed or performed during the hospital encounter of 11/19/18 (from the past 48 hour(s))  CBC with  Differential     Status: Abnormal   Collection Time: 11/19/18 10:47 AM  Result Value Ref Range   WBC 5.7 4.0 - 10.5 K/uL   RBC 5.36 4.22 - 5.81 MIL/uL   Hemoglobin 16.6 13.0 - 17.0 g/dL   HCT 48.3 39.0 - 52.0 %   MCV 90.1 80.0 - 100.0 fL   MCH 31.0 26.0 - 34.0 pg   MCHC 34.4 30.0 - 36.0 g/dL   RDW 12.1 11.5 - 15.5 %   Platelets 189 150 - 400 K/uL   nRBC 0.0 0.0 - 0.2 %   Neutrophils Relative % 79 %   Neutro Abs 4.5 1.7 - 7.7 K/uL   Lymphocytes Relative 10 %   Lymphs Abs 0.5 (L) 0.7 - 4.0 K/uL   Monocytes Relative 10 %   Monocytes Absolute 0.6 0.1 - 1.0 K/uL   Eosinophils Relative 0 %   Eosinophils Absolute 0.0 0.0 - 0.5 K/uL   Basophils Relative 1 %   Basophils Absolute 0.1 0.0 - 0.1 K/uL   Immature Granulocytes 0 %   Abs Immature Granulocytes 0.02 0.00 - 0.07 K/uL    Comment: Performed at [facility], [address] [place], [place] [address]  Comprehensive metabolic  panel     Status: Abnormal   Collection Time: 11/19/18 10:47 AM  Result Value Ref Range   Sodium 138 135 - 145 mmol/L   Potassium 3.5 3.5 - 5.1 mmol/L   Chloride 104 98 - 111 mmol/L   CO2 23 22 - 32 mmol/L   Glucose, Bld 115 (H) 70 - 99 mg/dL   BUN 15 6 - 20 mg/dL   Creatinine, Ser 1.03 0.61 - 1.24 mg/dL   Calcium 8.8 (L) 8.9 - 10.3 mg/dL   Total Protein 7.2 6.5 - 8.1 g/dL   Albumin 4.1 3.5 - 5.0 g/dL   AST 25 15 - 41 U/L   ALT 30 0 - 44 U/L   Alkaline Phosphatase 88 38 - 126 U/L   Total Bilirubin 2.7 (H) 0.3 - 1.2 mg/dL   GFR calc non Af Amer >60 >60 mL/min   GFR calc Af Amer >60 >60 mL/min   Anion gap 11 5 - 15    Comment: Performed at [facility], [address] [place], [place] [address]   Ct Abdomen Pelvis W Contrast  Result Date: 11/19/2018 CLINICAL DATA:  Mid abdominal pain since yesterday. Fever. Rule out appendicitis. EXAM: CT ABDOMEN AND PELVIS WITH CONTRAST TECHNIQUE: Multidetector CT imaging of the abdomen and pelvis was performed using the standard protocol  following bolus administration of intravenous contrast. CONTRAST:  123mL OMNIPAQUE IOHEXOL 300 MG/ML  SOLN COMPARISON:  06/18/2010 FINDINGS: Lower chest: Left base scarring. Normal heart size. Trace bilateral pleural fluid may be physiologic. Hepatobiliary: Normal liver. Normal gallbladder, without biliary ductal dilatation. Pancreas: Normal, without mass or ductal dilatation. Spleen: Normal in size, without focal abnormality. Adrenals/Urinary Tract: Normal adrenal glands. Normal kidneys, without hydronephrosis. Normal urinary bladder. Stomach/Bowel: Normal stomach, without wall thickening. Scattered colonic diverticula. Normal terminal ileum. The appendix is enlarged with mild surrounding inflammation. Appendicoliths within. Example 1.4 cm on image 70/2. No free perforation or periappendiceal fluid collection. Normal small bowel. Vascular/Lymphatic: Normal caliber of the aorta and branch vessels. No abdominopelvic adenopathy. Reproductive: Normal prostate. Other: No significant free fluid. Musculoskeletal: Disc bulge at the lumbosacral junction. IMPRESSION: 1. Non complicated appendicitis. 2. Trace bilateral pleural fluid, possibly physiologic. These results will be called to the ordering clinician or representative by the Radiologist Assistant, and communication documented in the PACS or zVision Dashboard. This exam was interpreted during a PACS downtime with limited availability of comparison cases. It has been flagged for review following the downtime. If clinically indicated after this review, an addendum will be issued providing details about comparison to prior imaging. Electronically Signed   By: Abigail Miyamoto M.D.   On: 11/19/2018 12:59    Review of Systems  Constitutional: Positive for fever (101.4). Negative for chills, diaphoresis, malaise/fatigue and weight loss.  HENT: Negative.   Eyes: Negative.   Respiratory: Negative.   Cardiovascular: Negative.   Gastrointestinal: Positive for abdominal  pain, blood in stool (Tuesday), nausea and vomiting.  Genitourinary: Negative.   Musculoskeletal: Negative.   Skin: Negative.   Neurological: Negative.   Endo/Heme/Allergies: Negative.   Psychiatric/Behavioral: Negative.     Blood pressure (!) 130/91, pulse 89, temperature 98.4 F (36.9 C), temperature source Oral, resp. rate 14, SpO2 96 %. Physical Exam  Constitutional: He is oriented to person, place, and time. He appears well-developed and well-nourished. No distress.  HENT:  Head: Normocephalic and atraumatic.  Mouth/Throat: Oropharynx is clear and moist. No oropharyngeal exudate.  Eyes: Right eye exhibits no discharge. Left eye exhibits no discharge.  No scleral icterus.  Pupils are equal  Neck: Normal range of motion. Neck supple. No JVD present. No tracheal deviation present. No thyromegaly present.  Cardiovascular: Normal rate, regular rhythm, normal heart sounds and intact distal pulses.  No murmur heard. Respiratory: Effort normal and breath sounds normal. No respiratory distress. He has no wheezes. He has no rales. He exhibits no tenderness.  GI: Soft. He exhibits no distension. There is abdominal tenderness (some RLQ tenderness, pain is much better now,  than it was 2 days ago).  Musculoskeletal:        General: No tenderness or edema.  Lymphadenopathy:    He has no cervical adenopathy.  Neurological: He is alert and oriented to person, place, and time. No cranial nerve deficit.  Skin: He is not diaphoretic.  He is flushed all over, no itching,no current fever  Psychiatric: He has a normal mood and affect. His behavior is normal. Judgment and thought content normal.     Assessment/Plan  Acute appendicitis Gilbert's disease Generalized anxiety disorder  Plan:  For surgery later this PM.  Risk and benefits discussed.  Kamdyn Colborn, PA-C 11/19/2018, 2:58 PM

## 2018-11-19 NOTE — Anesthesia Preprocedure Evaluation (Addendum)
Anesthesia Evaluation  Patient identified by MRN, date of birth, ID band Patient awake    Reviewed: Allergy & Precautions, NPO status , Patient's Chart, lab work & pertinent test results  History of Anesthesia Complications Negative for: history of anesthetic complications  Airway Mallampati: I  TM Distance: >3 FB Neck ROM: Full    Dental  (+) Teeth Intact   Pulmonary neg pulmonary ROS, former smoker,    breath sounds clear to auscultation       Cardiovascular negative cardio ROS   Rhythm:Regular     Neuro/Psych PSYCHIATRIC DISORDERS Anxiety negative neurological ROS  negative psych ROS   GI/Hepatic Neg liver ROS, appendicitis   Endo/Other  negative endocrine ROS  Renal/GU negative Renal ROS     Musculoskeletal   Abdominal   Peds  Hematology negative hematology ROS (+)   Anesthesia Other Findings   Reproductive/Obstetrics                            Anesthesia Physical Anesthesia Plan  ASA: II  Anesthesia Plan: General   Post-op Pain Management:    Induction: Intravenous, Rapid sequence and Cricoid pressure planned  PONV Risk Score and Plan: 2 and Dexamethasone and Ondansetron  Airway Management Planned: Oral ETT  Additional Equipment: None  Intra-op Plan:   Post-operative Plan: Extubation in OR  Informed Consent: I have reviewed the patients History and Physical, chart, labs and discussed the procedure including the risks, benefits and alternatives for the proposed anesthesia with the patient or authorized representative who has indicated his/her understanding and acceptance.     Dental advisory given  Plan Discussed with: CRNA and Surgeon  Anesthesia Plan Comments:         Anesthesia Quick Evaluation

## 2018-11-19 NOTE — Op Note (Signed)
11/19/2018  6:06 PM  PATIENT:  Xavier Gonzalez Number  53 y.o. male  PRE-OPERATIVE DIAGNOSIS:  acute appendicitis  POST-OPERATIVE DIAGNOSIS:  acute appendicitis  PROCEDURE:  Procedure(s): APPENDECTOMY LAPAROSCOPIC (N/A)  SURGEON:  Surgeon(s) and Role:    * Jovita Kussmaul, MD - Primary  PHYSICIAN ASSISTANT:   ASSISTANTS: none   ANESTHESIA:   local and general  EBL:  minimal   BLOOD ADMINISTERED:none  DRAINS: none   LOCAL MEDICATIONS USED:  MARCAINE     SPECIMEN:  Source of Specimen:  appendix  DISPOSITION OF SPECIMEN:  PATHOLOGY  COUNTS:  YES  TOURNIQUET:  * No tourniquets in log *  DICTATION: .Dragon Dictation   After informed consent was obtained patient was brought to the operating room placed in the supine position on the operating room table. After adequate induction of general anesthesia the patient's abdomen was prepped with ChloraPrep, allowed to dry, and draped in usual sterile manner. The area below the umbilicus was infiltrated with quarter percent Marcaine. A small incision was made with a 15 blade knife. This incision was carried down through the subcutaneous tissue bluntly with a hemostat and [employer] retractors until the linea alba was identified. The linea alba was incised with a 15 blade knife. Each side was grasped Coker clamps and elevated anteriorly. The preperitoneal space was probed bluntly with a hemostat until the peritoneum was opened and access was gained to the abdominal cavity. A 0 Vicryl purse string stitch was placed in the fascia surrounding the opening. A Hassan cannula was placed through the opening and anchored in place with the previously placed Vicryl purse string stitch. The laparoscope was placed through the [facility] cannula. The abdomen was insufflated with carbon dioxide without difficulty. Next the suprapubic area was infiltrated with quarter percent Marcaine. A small incision was made with a 15 blade knife. A 5 mm port was placed bluntly through  this incision into the abdominal cavity. A site was then chosen in the RUQ for placement of a 5 mm port. The area was infiltrated with quarter percent Marcaine. A small stab incision was made with a 15 blade knife. A 5 mm port was placed bluntly through this incision and the abdominal cavity under direct vision. The laparoscope was then moved to the suprapubic port. Using a Glassman grasper and harmonic scalpel the right lower quadrant was inspected. The appendix was readily identified. The appendix was elevated anteriorly and the mesoappendix was taken down sharply with the harmonic scalpel. Once the base of the appendix where it joined the cecum was identified and cleared of any tissue then a laparoscopic GIA blue load 6 row stapler was placed through the [facility] cannula. The stapler was placed across the base of the appendix clamped and fired thereby dividing the base of the appendix between staple lines. A laparoscopic bag was then inserted through the [facility] cannula. The appendix was placed within the bag and the bag was sealed. The abdomen was then irrigated with copious amounts of saline until the effluent was clear. No other abnormalities were noted. The appendix and bag were removed with the [facility] cannula through the infraumbilical port without difficulty. The fascial defect was closed with the previously placed Vicryl pursestring stitch as well as with another interrupted 0 Vicryl figure-of-eight stitch. The rest of the ports were removed under direct vision and were found to be hemostatic. The gas was allowed to escape. The skin incisions were closed with interrupted 4-0 Monocryl subcuticular stitches. Dermabond dressings were applied. The  patient tolerated the procedure well. At the end of the case all needle sponge and instrument counts were correct. The patient was then awakened and taken to recovery in stable condition.  PLAN OF CARE: Admit for overnight observation  PATIENT DISPOSITION:  PACU -  hemodynamically stable.   Delay start of Pharmacological VTE agent (>24hrs) due to surgical blood loss or risk of bleeding: no

## 2018-11-19 NOTE — Anesthesia Procedure Notes (Signed)
Procedure Name: Intubation Date/Time: 11/19/2018 5:15 PM Performed by: West Pugh, CRNA Pre-anesthesia Checklist: Patient identified, Emergency Drugs available, Suction available, Patient being monitored and Timeout performed Patient Re-evaluated:Patient Re-evaluated prior to induction Oxygen Delivery Method: Circle system utilized Preoxygenation: Pre-oxygenation with 100% oxygen Induction Type: IV induction, Cricoid Pressure applied and Rapid sequence Laryngoscope Size: Mac and 4 Grade View: Grade I Tube type: Oral Tube size: 7.5 mm Number of attempts: 1 Airway Equipment and Method: Stylet Placement Confirmation: ETT inserted through vocal cords under direct vision,  positive ETCO2,  CO2 detector and breath sounds checked- equal and bilateral Secured at: 22 cm Tube secured with: Tape Dental Injury: Teeth and Oropharynx as per pre-operative assessment

## 2018-11-19 NOTE — ED Notes (Signed)
Rt lower abd pain onset yesterday  States has had fever  Has taken otc meds.  Denies n/v/d

## 2018-11-19 NOTE — Transfer of Care (Signed)
Immediate Anesthesia Transfer of Care Note  Patient: Xavier Gonzalez  Procedure(s) Performed: APPENDECTOMY LAPAROSCOPIC (N/A Abdomen)  Patient Location: PACU  Anesthesia Type:General  Level of Consciousness: awake, alert , oriented and patient cooperative  Airway & Oxygen Therapy: Patient Spontanous Breathing and Patient connected to face mask oxygen  Post-op Assessment: Report given to RN and Post -op Vital signs reviewed and stable  Post vital signs: Reviewed and stable  Last Vitals:  Vitals Value Taken Time  BP 129/82 11/19/2018  6:30 PM  Temp 37.2 C 11/19/2018  6:20 PM  Pulse 98 11/19/2018  6:34 PM  Resp 12 11/19/2018  6:34 PM  SpO2 96 % 11/19/2018  6:34 PM  Vitals shown include unvalidated device data.  Last Pain:  Vitals:   11/19/18 1820  TempSrc:   PainSc: 0-No pain         Complications: No apparent anesthesia complications

## 2018-11-19 NOTE — ED Provider Notes (Signed)
Patient sent from [facility] with CT findings concerning for appendicitis. Patient initially presented to [facility] for 2 days of worsening RLQ abdominal pain. Associated with nausea and subjective fevers.   On my evaluation, patient with some mild tenderness noted to RLQ. General surgery consulted.   Surgery to see patient and take to the OR.    Portions of this note were generated with Lobbyist. Dictation errors may occur despite best attempts at proofreading.     Volanda Napoleon, PA-C 11/19/18 1711    Valarie Merino, MD 11/19/18 [phone]

## 2018-11-19 NOTE — ED Notes (Signed)
ED Provider at bedside. 

## 2018-11-19 NOTE — Telephone Encounter (Signed)
  He called in c/o abd pain that increases and decreases in intensity since Tuesday night/Wednesday morning.   The pain doubles him  Over when it's severe.   Also running a fever 101.  See triage notes.  I made him an appt with Dr. Nani Ravens for today at 10:15.     Reason for Disposition . [1] MODERATE pain (e.g., interferes with normal activities) AND [2] pain comes and goes (cramps) AND [3] present > 24 hours  (Exception: pain with Vomiting or Diarrhea - see that Guideline)  Answer Assessment - Initial Assessment Questions 1. LOCATION: "Where does it hurt?"      Abd pain started late Tues. Night.    I took some Tums.   3::00 am I was doubled over and could not sleep.   Could not use restroom.    No coughing or URI symptoms. 2. RADIATION: "Does the pain shoot anywhere else?" (e.g., chest, back)     It's minimal now.   Middle of my stomach the pain.   I'm feverish 101.4. 3. ONSET: "When did the pain begin?" (Minutes, hours or days ago)      Tuesday night 4. SUDDEN: "Gradual or sudden onset?"     Suddenly it hit me.   5. PATTERN "Does the pain come and go, or is it constant?"    - If constant: "Is it getting better, staying the same, or worsening?"      (Note: Constant means the pain never goes away completely; most serious pain is constant and it progresses)     - If intermittent: "How long does it last?" "Do you have pain now?"     (Note: Intermittent means the pain goes away completely between bouts)     Since it's yesterday it's faint but it's always there.   I ate one can of chicken noodle soup only yesterday.   Using Tylenol and Ibuprofen for the fever. 6. SEVERITY: "How bad is the pain?"  (e.g., Scale 1-10; mild, moderate, or severe)    - MILD (1-3): doesn't interfere with normal activities, abdomen soft and not tender to touch     - MODERATE (4-7): interferes with normal activities or awakens from sleep, tender to touch     - SEVERE (8-10): excruciating pain, doubled over, unable to  do any normal activities       7 on pain scale when doubled over.  7. RECURRENT SYMPTOM: "Have you ever had this type of abdominal pain before?" If so, ask: "When was the last time?" and "What happened that time?"      No   I do have Rosanna Randy something with my bilirubin  Many years ago. 8. CAUSE: "What do you think is causing the abdominal pain?"     I don't know 9. RELIEVING/AGGRAVATING FACTORS: "What makes it better or worse?" (e.g., movement, antacids, bowel movement)     Wednesday morning it was constant and now it's just there.   When it's bad no way I move helps it. 10. OTHER SYMPTOMS: "Has there been any vomiting, diarrhea, constipation, or urine problems?"       I made myself vomit but it was only a little mucus.    Since pain has hit I've not had a BM.    No urinary symptoms.  Not drinking as many fluids and I have no appetite.  Protocols used: ABDOMINAL PAIN - MALE-A-AH

## 2018-11-19 NOTE — Progress Notes (Signed)
CC: abd pain  Subjective: Patient is a 53 y.o. male here for abd pain.  Started around 1 day ago.  Is very sharp yesterday.  He has had no appetite.  Denies any nausea, vomiting, or bowel changes.  He still does have his appendix.  Nothing he notices makes it better or worse.  He had a temperature of 101F at home.  He took ibuprofen around 3 hours ago.   ROS: GI: as noted in HPI  Past Medical History:  Diagnosis Date  . GAD (generalized anxiety disorder)   . Gilbert's disease   . History of chicken pox     Objective: BP 112/72 (BP Location: Left Arm, Patient Position: Sitting, Cuff Size: Normal)   Pulse 95   Temp 98.2 F (36.8 C) (Oral)   Ht 6' (1.829 m)   Wt 186 lb 6 oz (84.5 kg)   SpO2 98%   BMI 25.28 kg/m  General: Awake, appears stated age Abd: BS, S, ttp in umbilical region in addition to right lower quadrant, positive McBurney sign, negative heel strike, Rovsing's, and Murphy's Heart: RRR, no murmurs Lungs: CTAB, no rales, wheezes or rhonchi. No accessory muscle use Psych: Age appropriate judgment and insight, normal affect and mood  Assessment and Plan: Acute right lower quadrant pain  I am concerned for acute appendicitis.  I will send him down to the emergency department for both logistical and financial reasons rather than ordering a CT scan outpatient.  I did speak with a provider in the emergency department regarding his case. The patient voiced understanding and agreement to the plan.  [place], DO 11/19/18  10:42 AM

## 2018-11-19 NOTE — ED Provider Notes (Signed)
Xavier Gonzalez Note   CSN: [id number] Arrival date & time: 11/19/18  1024    History   Chief Complaint Chief Complaint  Patient presents with   Abdominal Pain    HPI Xavier Gonzalez is a 53 y.o. male.     Patient presents for evaluation for possible appendicitis.  Patient has Gilbert's disease.  Patient's had gradually worsening abdominal pain initially central now right lower quadrant since Tuesday.  Nausea and subjective fevers.  No history of abdominal surgeries.  No significant medical problems.     Past Medical History:  Diagnosis Date   GAD (generalized anxiety disorder)    Gilbert's disease    History of chicken pox     Patient Active Problem List   Diagnosis Date Noted   Screen for colon cancer 04/30/2018   Generalized anxiety disorder 09/23/2016   Finger pain 08/14/2012   Painful orthopaedic hardware ([place]) 08/14/2012   Chest wall mass 01/31/2012   UPPER RESPIRATORY INFECTION 09/28/2010   ABDOMINAL PAIN, LOWER 06/18/2010   SINUSITIS, CHRONIC 06/06/2008   TACHYCARDIA 06/06/2008    Past Surgical History:  Procedure Laterality Date   FINGER SURGERY  08/2003   HERNIA REPAIR     TONSILLECTOMY AND ADENOIDECTOMY Bilateral         Home Medications    Prior to Admission medications   Medication Sig Start Date End Date Taking? Authorizing Gonzalez  FLUoxetine (PROZAC) 10 MG tablet Take 1 tablet (10 mg total) by mouth daily. 04/30/18   Shelda Pal, DO  L-Lysine 500 MG TABS Take 1,000 mg by mouth daily.    Gonzalez, Historical, MD    Family History Family History  Problem Relation Age of Onset   Diabetes Mother    COPD Mother    Hypertension Father    Anxiety disorder Daughter    Diabetes Maternal Grandmother    COPD Maternal Grandmother    Cancer Maternal Uncle    Colon cancer Neg Hx    Colon polyps Neg Hx    Esophageal cancer Neg Hx    Rectal cancer Neg Hx    Stomach  cancer Neg Hx     Social History Social History   Tobacco Use   Smoking status: Former Smoker   Smokeless tobacco: Never Used  Substance Use Topics   Alcohol use: Yes    Alcohol/week: 6.0 standard drinks    Types: 6 Cans of beer per week   Drug use: No     Allergies   Patient has no known allergies.   Review of Systems Review of Systems  Constitutional: Positive for appetite change and fever. Negative for chills.  HENT: Negative for congestion.   Eyes: Negative for visual disturbance.  Respiratory: Negative for shortness of breath.   Cardiovascular: Negative for chest pain.  Gastrointestinal: Positive for abdominal pain. Negative for vomiting.  Genitourinary: Negative for dysuria, flank pain and testicular pain.  Musculoskeletal: Negative for back pain, neck pain and neck stiffness.  Skin: Negative for rash.  Neurological: Negative for light-headedness and headaches.     Physical Exam Updated Vital Signs BP (!) 130/91 (BP Location: Left Arm)    Pulse 89    Temp 98.4 F (36.9 C) (Oral)    Resp 14    SpO2 96%   Physical Exam Vitals signs and nursing note reviewed.  Constitutional:      Appearance: He is well-developed.  HENT:     Head: Normocephalic and atraumatic.  Eyes:  General:        Right eye: No discharge.        Left eye: No discharge.     Conjunctiva/sclera: Conjunctivae normal.  Neck:     Musculoskeletal: Normal range of motion and neck supple.     Trachea: No tracheal deviation.  Cardiovascular:     Rate and Rhythm: Normal rate.  Pulmonary:     Effort: Pulmonary effort is normal.  Abdominal:     General: There is no distension.     Palpations: Abdomen is soft.     Tenderness: There is abdominal tenderness in the right lower quadrant. There is no guarding.  Skin:    General: Skin is warm.     Findings: No rash.  Neurological:     Mental Status: He is alert and oriented to person, place, and time.      ED Treatments / Results    Labs (all labs ordered are listed, but only abnormal results are displayed) Labs Reviewed  CBC WITH DIFFERENTIAL/PLATELET - Abnormal; Notable for the following components:      Result Value   Lymphs Abs 0.5 (*)    All other components within normal limits  COMPREHENSIVE METABOLIC PANEL - Abnormal; Notable for the following components:   Glucose, Bld 115 (*)    Calcium 8.8 (*)    Total Bilirubin 2.7 (*)    All other components within normal limits    EKG None  Radiology Ct Abdomen Pelvis W Contrast  Result Date: 11/19/2018 CLINICAL DATA:  Mid abdominal pain since yesterday. Fever. Rule out appendicitis. EXAM: CT ABDOMEN AND PELVIS WITH CONTRAST TECHNIQUE: Multidetector CT imaging of the abdomen and pelvis was performed using the standard protocol following bolus administration of intravenous contrast. CONTRAST:  175mL OMNIPAQUE IOHEXOL 300 MG/ML  SOLN COMPARISON:  06/18/2010 FINDINGS: Lower chest: Left base scarring. Normal heart size. Trace bilateral pleural fluid may be physiologic. Hepatobiliary: Normal liver. Normal gallbladder, without biliary ductal dilatation. Pancreas: Normal, without mass or ductal dilatation. Spleen: Normal in size, without focal abnormality. Adrenals/Urinary Tract: Normal adrenal glands. Normal kidneys, without hydronephrosis. Normal urinary bladder. Stomach/Bowel: Normal stomach, without wall thickening. Scattered colonic diverticula. Normal terminal ileum. The appendix is enlarged with mild surrounding inflammation. Appendicoliths within. Example 1.4 cm on image 70/2. No free perforation or periappendiceal fluid collection. Normal small bowel. Vascular/Lymphatic: Normal caliber of the aorta and branch vessels. No abdominopelvic adenopathy. Reproductive: Normal prostate. Other: No significant free fluid. Musculoskeletal: Disc bulge at the lumbosacral junction. IMPRESSION: 1. Non complicated appendicitis. 2. Trace bilateral pleural fluid, possibly physiologic. These  results will be called to the ordering clinician or representative by the Radiologist Assistant, and communication documented in the PACS or zVision Dashboard. This exam was interpreted during a PACS downtime with limited availability of comparison cases. It has been flagged for review following the downtime. If clinically indicated after this review, an addendum will be issued providing details about comparison to prior imaging. Electronically Signed   By: Abigail Miyamoto M.D.   On: 11/19/2018 12:59    Procedures Procedures (including critical care time)  Medications Ordered in ED Medications  iohexol (OMNIPAQUE) 300 MG/ML solution 100 mL (100 mLs Intravenous Contrast Given 11/19/18 1133)     Initial Impression / Assessment and Plan / ED Course  I have reviewed the triage vital signs and the nursing notes.  Pertinent labs & imaging results that were available during my care of the patient were reviewed by me and considered in my medical decision making (  see chart for details).       Patient presents with gradually worsening abdominal pain and nausea.  Discussed differential diagnosis and plan for CT scan to look for appendicitis.  No fever in the ER normal white blood cell count no peritonitis on exam.  Blood work reassuring normal white blood cell count no fever.  CT scan results discussed with radiology consistent with uncomplicated appendicitis.  Discussed with general surgeon recommends transfer to [facility] long emergency room and they will see the patient when they arrive.  Discussed with Dr. Verneda Skill who accepted the patient was along emergency room.  Updated the patient and family.  N.p.o. discussed.  Final Clinical Impressions(s) / ED Diagnoses   Final diagnoses:  Right lower quadrant abdominal pain  Acute appendicitis, unspecified acute appendicitis type    ED Discharge Orders    None       Elnora Morrison, MD 11/19/18 1342

## 2018-11-19 NOTE — Patient Instructions (Signed)
Go to the ER.

## 2018-11-20 ENCOUNTER — Encounter (HOSPITAL_COMMUNITY): Payer: Self-pay | Admitting: General Surgery

## 2018-11-20 LAB — HIV ANTIBODY (ROUTINE TESTING W REFLEX): HIV Screen 4th Generation wRfx: NONREACTIVE

## 2018-11-20 MED ORDER — IBUPROFEN 600 MG PO TABS: 600.0000 mg | ORAL_TABLET | Freq: Three times a day (TID) | ORAL | 0 refills | Status: DC | PRN

## 2018-11-20 MED ORDER — OXYCODONE HCL 5 MG PO TABS: 5.0000 mg | ORAL_TABLET | Freq: Four times a day (QID) | ORAL | 0 refills | Status: DC | PRN

## 2018-11-20 MED ORDER — ACETAMINOPHEN 500 MG PO TABS: 1000.0000 mg | ORAL_TABLET | Freq: Four times a day (QID) | ORAL | 0 refills | Status: DC | PRN

## 2018-11-20 NOTE — Discharge Instructions (Signed)
[facility]  Please arrive at least 30 min before your appointment to complete your check in paperwork.  If you are unable to arrive 30 min prior to your appointment time we may have to cancel or reschedule you. LAPAROSCOPIC SURGERY: POST OP INSTRUCTIONS Always review your discharge instruction sheet given to you by the facility where your surgery was performed. IF YOU HAVE DISABILITY OR FAMILY LEAVE FORMS, YOU MUST BRING THEM TO THE OFFICE FOR PROCESSING.   DO NOT GIVE THEM TO YOUR DOCTOR.  PAIN CONTROL  1. First take acetaminophen (Tylenol) AND/or ibuprofen (Advil) to control your pain after surgery.  Follow directions on package.  Taking acetaminophen (Tylenol) and/or ibuprofen (Advil) regularly after surgery will help to control your pain and lower the amount of prescription pain medication you may need.  You should not take more than 4,000 mg (4 grams) of acetaminophen (Tylenol) in 24 hours.  You should not take ibuprofen (Advil), aleve, motrin, naprosyn or other NSAIDS if you have a history of stomach ulcers or chronic kidney disease.  2. A prescription for pain medication may be given to you upon discharge.  Take your pain medication as prescribed, if you still have uncontrolled pain after taking acetaminophen (Tylenol) or ibuprofen (Advil). 3. Use ice packs to help control pain. 4. If you need a refill on your pain medication, please contact your pharmacy.  They will contact our office to request authorization. Prescriptions will not be filled after 5pm or on week-ends.  HOME MEDICATIONS 5. Take your usually prescribed medications unless otherwise directed.  DIET 6. You should follow a light diet the first few days after arrival home.  Be sure to include lots of fluids daily. Avoid fatty, fried foods.   CONSTIPATION 7. It is common to experience some constipation after surgery and if you are taking pain medication.  Increasing fluid intake and taking a stool  softener (such as Colace) will usually help or prevent this problem from occurring.  A mild laxative (Milk of Magnesia or Miralax) should be taken according to package instructions if there are no bowel movements after 48 hours.  WOUND/INCISION CARE 8. Most patients will experience some swelling and bruising in the area of the incisions.  Ice packs will help.  Swelling and bruising can take several days to resolve.  9. Unless discharge instructions indicate otherwise, follow guidelines below  a. STERI-STRIPS - you may remove your outer bandages 48 hours after surgery, and you may shower at that time.  You have steri-strips (small skin tapes) in place directly over the incision.  These strips should be left on the skin for 7-10 days.   b. DERMABOND/SKIN GLUE - you may shower in 24 hours.  The glue will flake off over the next 2-3 weeks. 10. Any sutures or staples will be removed at the office during your follow-up visit.  ACTIVITIES 11. You may resume regular (light) daily activities beginning the next day--such as daily self-care, walking, climbing stairs--gradually increasing activities as tolerated.  You may have sexual intercourse when it is comfortable.  Refrain from any heavy lifting or straining until approved by your doctor. a. You may drive when you are no longer taking prescription pain medication, you can comfortably wear a seatbelt, and you can safely maneuver your car and apply brakes.  FOLLOW-UP 12. You should see your doctor in the office for a follow-up appointment approximately 2-3 weeks after your surgery.  You should have been given your post-op/follow-up appointment when   your surgery was scheduled.  If you did not receive a post-op/follow-up appointment, make sure that you call for this appointment within a day or two after you arrive home to insure a convenient appointment time.   WHEN TO CALL YOUR DOCTOR: 1. Fever over 101.0 2. Inability to urinate 3. Continued bleeding from  incision. 4. Increased pain, redness, or drainage from the incision. 5. Increasing abdominal pain  The clinic staff is available to answer your questions during regular business hours.  Please don't hesitate to call and ask to speak to one of the nurses for clinical concerns.  If you have a medical emergency, go to the nearest emergency room or call 911.  A surgeon from [facility] is always on call at the hospital. [address], [address], [place], [place]  [address] ? [address] [address], [place], [place]   [address] [phone] ? [phone] ? FAX [phone]  .........   Managing Your Pain After Surgery Without Opioids    Thank you for participating in our program to help patients manage their pain after surgery without opioids. This is part of our effort to provide you with the best care possible, without exposing you or your family to the risk that opioids pose.  What pain can I expect after surgery? You can expect to have some pain after surgery. This is normal. The pain is typically worse the day after surgery, and quickly begins to get better. Many studies have found that many patients are able to manage their pain after surgery with Over-the-Counter (OTC) medications such as Tylenol and Motrin. If you have a condition that does not allow you to take Tylenol or Motrin, notify your surgical team.  How will I manage my pain? The best strategy for controlling your pain after surgery is around the clock pain control with Tylenol (acetaminophen) and Motrin (ibuprofen or Advil). Alternating these medications with each other allows you to maximize your pain control. In addition to Tylenol and Motrin, you can use heating pads or ice packs on your incisions to help reduce your pain.  How will I alternate your regular strength over-the-counter pain medication? You will take a dose of pain medication every three hours. ; Start by taking 650 mg of Tylenol (2 pills of 325  mg) ; 3 hours later take 600 mg of Motrin (3 pills of 200 mg) ; 3 hours after taking the Motrin take 650 mg of Tylenol ; 3 hours after that take 600 mg of Motrin.   - 1 -  See example - if your first dose of Tylenol is at 12:00 PM   12:00 PM Tylenol 650 mg (2 pills of 325 mg)  3:00 PM Motrin 600 mg (3 pills of 200 mg)  6:00 PM Tylenol 650 mg (2 pills of 325 mg)  9:00 PM Motrin 600 mg (3 pills of 200 mg)  Continue alternating every 3 hours   We recommend that you follow this schedule around-the-clock for at least 3 days after surgery, or until you feel that it is no longer needed. Use the table on the last page of this handout to keep track of the medications you are taking. Important: Do not take more than 3000mg of Tylenol or 3200mg of Motrin in a 24-hour period. Do not take ibuprofen/Motrin if you have a history of bleeding stomach ulcers, severe kidney disease, &/or actively taking a blood thinner  What if I still have pain? If you have pain that is not   controlled with the over-the-counter pain medications (Tylenol and Motrin or Advil) you might have what we call "breakthrough" pain. You will receive a prescription for a small amount of an opioid pain medication such as Oxycodone, Tramadol, or Tylenol with Codeine. Use these opioid pills in the first 24 hours after surgery if you have breakthrough pain. Do not take more than 1 pill every 4-6 hours.  If you still have uncontrolled pain after using all opioid pills, don't hesitate to call our staff using the number provided. We will help make sure you are managing your pain in the best way possible, and if necessary, we can provide a prescription for additional pain medication.   Day 1    Time  Name of Medication Number of pills taken  Amount of Acetaminophen  Pain Level   Comments  AM PM       AM PM       AM PM       AM PM       AM PM       AM PM       AM PM       AM PM       Total Daily amount of Acetaminophen Do not  take more than  3,000 mg per day      Day 2    Time  Name of Medication Number of pills taken  Amount of Acetaminophen  Pain Level   Comments  AM PM       AM PM       AM PM       AM PM       AM PM       AM PM       AM PM       AM PM       Total Daily amount of Acetaminophen Do not take more than  3,000 mg per day      Day 3    Time  Name of Medication Number of pills taken  Amount of Acetaminophen  Pain Level   Comments  AM PM       AM PM       AM PM       AM PM          AM PM       AM PM       AM PM       AM PM       Total Daily amount of Acetaminophen Do not take more than  3,000 mg per day      Day 4    Time  Name of Medication Number of pills taken  Amount of Acetaminophen  Pain Level   Comments  AM PM       AM PM       AM PM       AM PM       AM PM       AM PM       AM PM       AM PM       Total Daily amount of Acetaminophen Do not take more than  3,000 mg per day      Day 5    Time  Name of Medication Number of pills taken  Amount of Acetaminophen  Pain Level   Comments  AM PM       AM PM       AM   PM       AM PM       AM PM       AM PM       AM PM       AM PM       Total Daily amount of Acetaminophen Do not take more than  3,000 mg per day       Day 6    Time  Name of Medication Number of pills taken  Amount of Acetaminophen  Pain Level  Comments  AM PM       AM PM       AM PM       AM PM       AM PM       AM PM       AM PM       AM PM       Total Daily amount of Acetaminophen Do not take more than  3,000 mg per day      Day 7    Time  Name of Medication Number of pills taken  Amount of Acetaminophen  Pain Level   Comments  AM PM       AM PM       AM PM       AM PM       AM PM       AM PM       AM PM       AM PM       Total Daily amount of Acetaminophen Do not take more than  3,000 mg per day        For additional information about how and where to safely dispose of unused  opioid medications - https://www.morepowerfulnc.org  Disclaimer: This document contains information and/or instructional materials adapted from [facility] for the typical patient with your condition. It does not replace medical advice from your health care provider because your experience may differ from that of the typical patient. Talk to your health care provider if you have any questions about this document, your condition or your treatment plan. Adapted from [facility]   

## 2018-11-20 NOTE — Discharge Summary (Signed)
     Patient ID: Xavier Gonzalez [id number] 20-Apr-1966 53 y.o.  Admit date: 11/19/2018 Discharge date: 11/20/2018  Admitting Diagnosis: Acute appendicitis  Discharge Diagnosis Patient Active Problem List   Diagnosis Date Noted  . Acute appendicitis 11/19/2018  . Screen for colon cancer 04/30/2018  . Generalized anxiety disorder 09/23/2016  . Finger pain 08/14/2012  . Painful orthopaedic hardware ([place]) 08/14/2012  . Chest wall mass 01/31/2012  . UPPER RESPIRATORY INFECTION 09/28/2010  . ABDOMINAL PAIN, LOWER 06/18/2010  . SINUSITIS, CHRONIC 06/06/2008  . TACHYCARDIA 06/06/2008    Consultants none  Reason for Admission: Pt is a 53 y/o male who presented to the family practice center today with 2 days of increasing abdominal pain, and fever up to 101.4.  He was doubled over with pain on Tuesday.  He was sent from his PCP to the ED at [facility] today.  He is really feeling better right now.    Work up in the ED:  He is afebrile, BP slightly elevated.  Labs are normal except a bilirubin of 2.7.  CT shows an enlarged appendix 1.4 cm with appendicoliths, no free perforation or periappendiceal fluid collection.  Pt transferred to [facility] for evaluation  Procedures Lap appy, Dr. Marlou Starks 3/12  Hospital Course:  The patient was admitted and underwent a laparoscopic appendectomy.  The patient tolerated the procedure well.  On POD 1, the patient was tolerating a regular diet, voiding well, mobilizing, and pain was controlled with oral pain medications.  The patient was stable for DC home at this time with appropriate follow up made.    Physical Exam: Abd: soft, appropriately tender, +BS, ND, incisions c/d/i  Allergies as of 11/20/2018   No Known Allergies     Medication List    TAKE these medications   acetaminophen 500 MG tablet Commonly known as:  TYLENOL Take 2 tablets (1,000 mg total) by mouth every 6 (six) hours as needed. What changed:    medication strength  how much to take    FLUoxetine 10 MG tablet Commonly known as:  PROZAC Take 1 tablet (10 mg total) by mouth daily.   ibuprofen 600 MG tablet Commonly known as:  ADVIL,MOTRIN Take 1 tablet (600 mg total) by mouth every 8 (eight) hours as needed. What changed:    medication strength  how much to take  when to take this   L-Lysine 500 MG Tabs Take 1,000 mg by mouth daily.   oxyCODONE 5 MG immediate release tablet Commonly known as:  Oxy IR/ROXICODONE Take 1 tablet (5 mg total) by mouth every 6 (six) hours as needed for moderate pain.        Follow-up Information    [facility] [place] Follow up on 12/01/2018.   Specialty:  General Surgery Why:  Your appointment is at11:45 AM .  Be at the office 30 minutes early for check in.  Bring photo ID and insurance information.   Contact information: [place] [address] [phone]           Signed: Saverio Danker, [facility] 11/20/2018, 10:56 AM Pager: [phone]

## 2018-11-20 NOTE — Plan of Care (Signed)
  Problem: Education: Goal: Required Educational Video(s) Outcome: Adequate for Discharge   Problem: Clinical Measurements: Goal: Postoperative complications will be avoided or minimized Outcome: Adequate for Discharge   Problem: Skin Integrity: Goal: Demonstration of wound healing without infection will improve Outcome: Adequate for Discharge   Problem: Education: Goal: Knowledge of General Education information will improve Description: Including pain rating scale, medication(s)/side effects and non-pharmacologic comfort measures Outcome: Adequate for Discharge   Problem: Health Behavior/Discharge Planning: Goal: Ability to manage health-related needs will improve Outcome: Adequate for Discharge   Problem: Clinical Measurements: Goal: Ability to maintain clinical measurements within normal limits will improve Outcome: Adequate for Discharge Goal: Will remain free from infection Outcome: Adequate for Discharge Goal: Diagnostic test results will improve Outcome: Adequate for Discharge Goal: Respiratory complications will improve Outcome: Adequate for Discharge Goal: Cardiovascular complication will be avoided Outcome: Adequate for Discharge   Problem: Activity: Goal: Risk for activity intolerance will decrease Outcome: Adequate for Discharge   Problem: Nutrition: Goal: Adequate nutrition will be maintained Outcome: Adequate for Discharge   Problem: Coping: Goal: Level of anxiety will decrease Outcome: Adequate for Discharge   Problem: Elimination: Goal: Will not experience complications related to bowel motility Outcome: Adequate for Discharge Goal: Will not experience complications related to urinary retention Outcome: Adequate for Discharge   Problem: Pain Managment: Goal: General experience of comfort will improve Outcome: Adequate for Discharge   Problem: Safety: Goal: Ability to remain free from injury will improve Outcome: Adequate for Discharge    Problem: Skin Integrity: Goal: Risk for impaired skin integrity will decrease Outcome: Adequate for Discharge   

## 2018-11-27 NOTE — Anesthesia Postprocedure Evaluation (Signed)
Anesthesia Post Note  Patient: Xavier Gonzalez  Procedure(s) Performed: APPENDECTOMY LAPAROSCOPIC (N/A Abdomen)     Patient location during evaluation: PACU Anesthesia Type: General Level of consciousness: awake and alert Pain management: pain level controlled Vital Signs Assessment: post-procedure vital signs reviewed and stable Respiratory status: spontaneous breathing, nonlabored ventilation, respiratory function stable and patient connected to nasal cannula oxygen Cardiovascular status: blood pressure returned to baseline and stable Postop Assessment: no apparent nausea or vomiting Anesthetic complications: no    Last Vitals:  Vitals:   11/20/18 0155 11/20/18 0536  BP: 111/74 120/71  Pulse: 97 74  Resp: 17 17  Temp: (!) 36.4 C 36.5 C  SpO2: 98% 98%    Last Pain:  Vitals:   11/20/18 0841  TempSrc:   PainSc: 0-No pain                 Johany Hansman

## 2019-03-24 ENCOUNTER — Other Ambulatory Visit: Payer: Self-pay | Admitting: Family Medicine

## 2019-03-24 MED ORDER — FLUOXETINE HCL 10 MG PO TABS: 10.0000 mg | ORAL_TABLET | Freq: Every day | ORAL | 1 refills | Status: DC

## 2019-03-24 NOTE — Telephone Encounter (Signed)
Rx sent 

## 2019-03-24 NOTE — Telephone Encounter (Signed)
Copied from [place] [id number]. Topic: Quick Communication - Rx Refill/Question >> Mar 24, 2019 10:44 AM Yvette Rack wrote: Medication: FLUoxetine (PROZAC) 10 MG tablet  Has the patient contacted their pharmacy? yes   Preferred Pharmacy (with phone number or street name): [id number] [place], [place] [phone] (Phone)  [phone] (Fax)  Agent: Please be advised that RX refills may take up to 3 business days. We ask that you follow-up with your pharmacy.

## 2019-05-05 ENCOUNTER — Ambulatory Visit (INDEPENDENT_AMBULATORY_CARE_PROVIDER_SITE_OTHER): Payer: BC Managed Care – PPO | Admitting: Family Medicine

## 2019-05-05 ENCOUNTER — Other Ambulatory Visit: Payer: Self-pay

## 2019-05-05 ENCOUNTER — Encounter: Payer: Self-pay | Admitting: Family Medicine

## 2019-05-05 VITALS — BP 120/62 | HR 78 | Temp 97.4°F | Ht 72.0 in | Wt 185.2 lb

## 2019-05-05 DIAGNOSIS — Z0000 Encounter for general adult medical examination without abnormal findings: Secondary | ICD-10-CM | POA: Diagnosis not present

## 2019-05-05 DIAGNOSIS — Z125 Encounter for screening for malignant neoplasm of prostate: Secondary | ICD-10-CM

## 2019-05-05 LAB — COMPREHENSIVE METABOLIC PANEL
ALT: 19 U/L (ref 0–53)
AST: 18 U/L (ref 0–37)
Albumin: 4.3 g/dL (ref 3.5–5.2)
Alkaline Phosphatase: 84 U/L (ref 39–117)
BUN: 13 mg/dL (ref 6–23)
CO2: 30 mEq/L (ref 19–32)
Calcium: 9 mg/dL (ref 8.4–10.5)
Chloride: 104 mEq/L (ref 96–112)
Creatinine, Ser: 1 mg/dL (ref 0.40–1.50)
GFR: 78.75 mL/min (ref >60.00)
Glucose, Bld: 104 mg/dL — ABNORMAL HIGH (ref 70–99)
Potassium: 4.1 mEq/L (ref 3.5–5.1)
Sodium: 140 mEq/L (ref 135–145)
Total Bilirubin: 1.5 mg/dL — ABNORMAL HIGH (ref 0.2–1.2)
Total Protein: 6.3 g/dL (ref 6.0–8.3)

## 2019-05-05 LAB — CBC
HCT: 45.1 % (ref 39.0–52.0)
Hemoglobin: 15.9 g/dL (ref 13.0–17.0)
MCHC: 35.2 g/dL (ref 30.0–36.0)
MCV: 90.2 fl (ref 78.0–100.0)
Platelets: 238 10*3/uL (ref 150.0–400.0)
RBC: 5 Mil/uL (ref 4.22–5.81)
RDW: 12.7 % (ref 11.5–15.5)
WBC: 5.6 10*3/uL (ref 4.0–10.5)

## 2019-05-05 LAB — LIPID PANEL
Cholesterol: 209 mg/dL — ABNORMAL HIGH (ref 0–200)
HDL: 36.3 mg/dL — ABNORMAL LOW (ref >39.00)
LDL Cholesterol: 145 mg/dL — ABNORMAL HIGH (ref 0–99)
NonHDL: 172.2
Total CHOL/HDL Ratio: 6
Triglycerides: 135 mg/dL (ref 0.0–149.0)
VLDL: 27 mg/dL (ref 0.0–40.0)

## 2019-05-05 LAB — PSA: PSA: 0.61 ng/mL (ref 0.10–4.00)

## 2019-05-05 NOTE — Patient Instructions (Addendum)
Give [place] 2-3 business days to get the results of your labs back.   Keep the diet clean and stay active.  The new Shingrix vaccine (for shingles) is a 2 shot series. It can make people feel low energy, achy and almost like they have the flu for 48 hours after injection. Please plan accordingly when deciding on when to get this shot. Call our office for a nurse visit appointment to get this. The second shot of the series is less severe regarding the side effects, but it still lasts 48 hours.   I recommend getting the flu shot in mid October. This suggestion would change if the CDC comes out with a different recommendation.   Let us know if you need anything.

## 2019-05-05 NOTE — Progress Notes (Signed)
Chief Complaint  Patient presents with  . Follow-up    Well Male Xavier Gonzalez is here for a complete physical.   His last physical was >1 year ago.  Current diet: in general, a "healthy" diet.  Current exercise: golf, walking Weight trend: stable Daytime fatigue? No. Seat belt? Yes.    Health maintenance Shingrix- No Colonoscopy- Yes Tetanus- Yes HIV- Yes   Past Medical History:  Diagnosis Date  . GAD (generalized anxiety disorder)   . Gilbert's disease   . History of chicken pox       Past Surgical History:  Procedure Laterality Date  . FINGER SURGERY  08/2003  . HERNIA REPAIR    . LAPAROSCOPIC APPENDECTOMY N/A 11/19/2018   Procedure: APPENDECTOMY LAPAROSCOPIC;  Surgeon: Jovita Kussmaul, MD;  Location: [facility];  Service: General;  Laterality: N/A;  . TONSILLECTOMY AND ADENOIDECTOMY Bilateral     Medications  Current Outpatient Medications on File Prior to Visit  Medication Sig Dispense Refill  . FLUoxetine (PROZAC) 10 MG tablet Take 1 tablet (10 mg total) by mouth daily. 90 tablet 1  . L-Lysine 500 MG TABS Take 1,000 mg by mouth daily.     Allergies No Known Allergies  Family History Family History  Problem Relation Age of Onset  . Diabetes Mother   . COPD Mother   . Hypertension Father   . Anxiety disorder Daughter   . Diabetes Maternal Grandmother   . COPD Maternal Grandmother   . Cancer Maternal Uncle   . Colon cancer Neg Hx   . Colon polyps Neg Hx   . Esophageal cancer Neg Hx   . Rectal cancer Neg Hx   . Stomach cancer Neg Hx     Review of Systems: Constitutional:  no fevers Eye:  no recent significant change in vision Ear/Nose/Mouth/Throat:  Ears:  no hearing loss Nose/Mouth/Throat:  no complaints of nasal congestion, no sore throat Cardiovascular:  no chest pain, no palpitations Respiratory:  no cough and no shortness of breath Gastrointestinal:  no abdominal pain, no change in bowel habits GU:  Male: negative for dysuria, frequency, and  incontinence and negative for prostate symptoms Musculoskeletal/Extremities:  no pain, redness, or swelling of the joints Integumentary (Skin/Breast):  no abnormal skin lesions reported Neurologic:  no headaches Endocrine: No unexpected weight changes Hematologic/Lymphatic:  no abnormal bleeding  Exam BP 120/62 (BP Location: Right Arm, Patient Position: Sitting, Cuff Size: Normal)   Pulse 78   Temp (!) 97.4 F (36.3 C) (Temporal)   Ht 6' (1.829 m)   Wt 185 lb 4 oz (84 kg)   SpO2 97%   BMI 25.12 kg/m  General:  well developed, well nourished, in no apparent distress Skin:  no significant moles, warts, or growths Head:  no masses, lesions, or tenderness Eyes:  pupils equal and round, sclera anicteric without injection Ears:  canals without lesions, TMs shiny without retraction, no obvious effusion, no erythema Nose:  nares patent, septum midline, mucosa normal Throat/Pharynx:  lips and gingiva without lesion; tongue and uvula midline; non-inflamed pharynx; no exudates or postnasal drainage Neck: neck supple without adenopathy, thyromegaly, or masses Lungs:  clear to auscultation, breath sounds equal bilaterally, no respiratory distress Cardio:  regular rate and rhythm, no LE edema, no bruits Abdomen:  abdomen soft, nontender; bowel sounds normal; no masses or organomegaly Genital (male): circumcised penis, no lesions or discharge; testes present bilaterally without masses or tenderness Rectal: Deferred Musculoskeletal:  symmetrical muscle groups noted without atrophy or deformity Extremities:  no clubbing, cyanosis, or edema, no deformities, no skin discoloration Neuro:  gait normal; deep tendon reflexes normal and symmetric Psych: well oriented with normal range of affect and appropriate judgment/insight  Assessment and Plan  Well adult exam - Plan: CBC, Comprehensive metabolic panel, Lipid panel  Screening for prostate cancer - Plan: PSA   Well 53 y.o. male. Counseled on  diet and exercise. Counseled on risks and benefits of prostate cancer screening with PSA. The patient agrees to undergo testing. Immunizations, labs, and further orders as above. Follow up in 6 mo. The patient voiced understanding and agreement to the plan.  [place], DO 05/05/19 7:29 AM

## 2019-05-25 IMAGING — CT CT ABDOMEN AND PELVIS WITH CONTRAST
2 of 5 series · 16 of 46 positions shown, 18 images · IV contrast (APPLIED)
Comparison: 06/18/2010

CLINICAL DATA: Mid abdominal pain since yesterday. Fever. Rule out
appendicitis.

EXAM:
CT ABDOMEN AND PELVIS WITH CONTRAST
TECHNIQUE: Multidetector CT imaging of the abdomen and pelvis was performed
using the standard protocol following bolus administration of
intravenous contrast.
CONTRAST:  100mL OMNIPAQUE IOHEXOL 300 MG/ML  SOLN

[Series 2: axial st · axial · 0.88mm/px · z∈[-549,-54]mm · 13 of 111 slices shown, 15 images]
[im 6/111  soft-tissue]
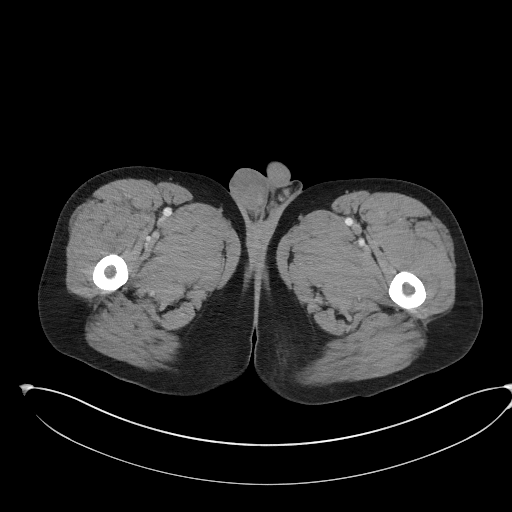
[im 6/111  bone]
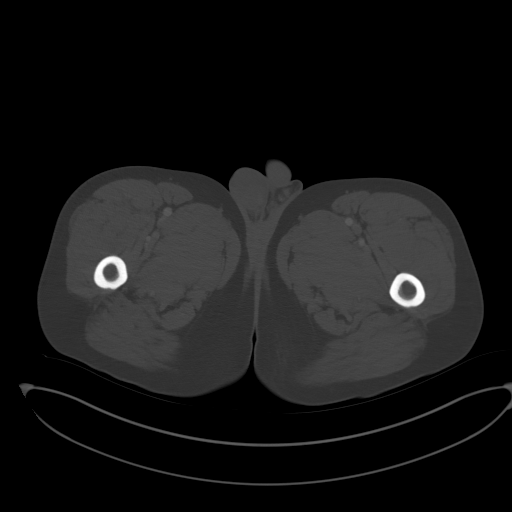
[im 18/111  soft-tissue]
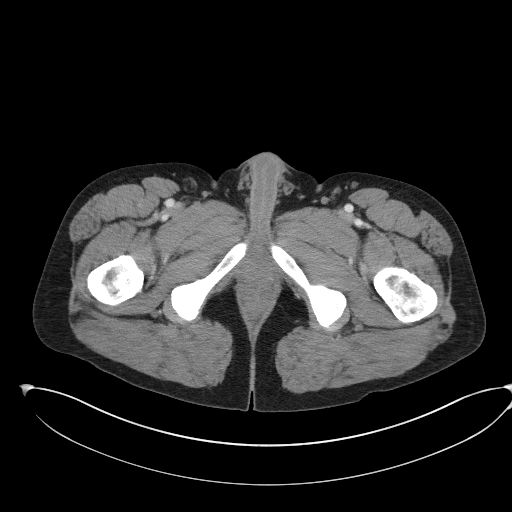
[im 24/111  soft-tissue]
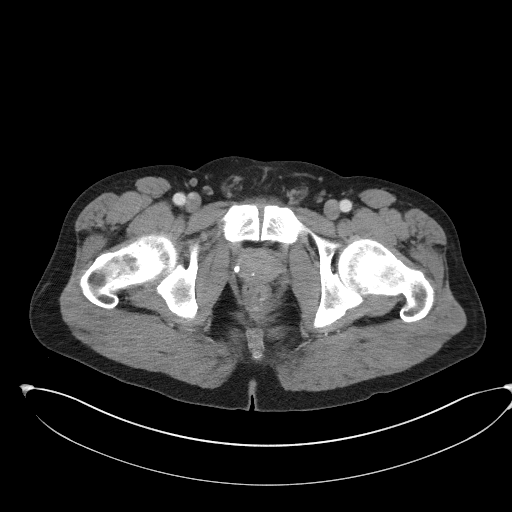
[im 29/111  soft-tissue]
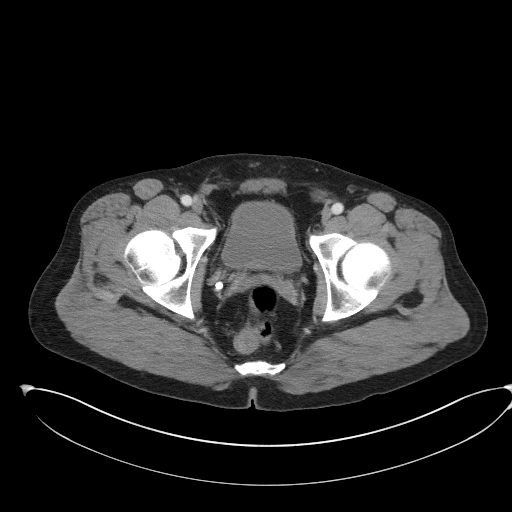
[im 41/111  soft-tissue]
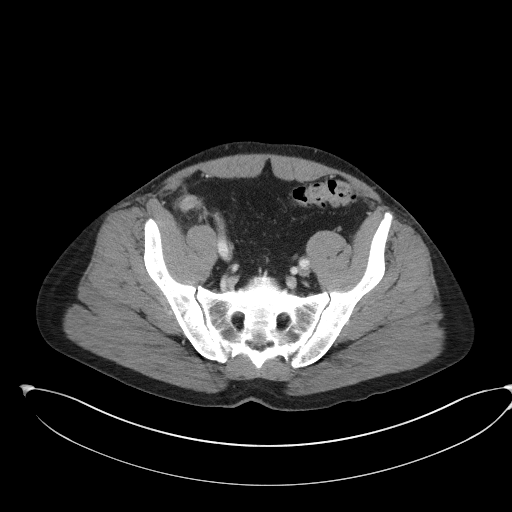
[im 47/111  soft-tissue]
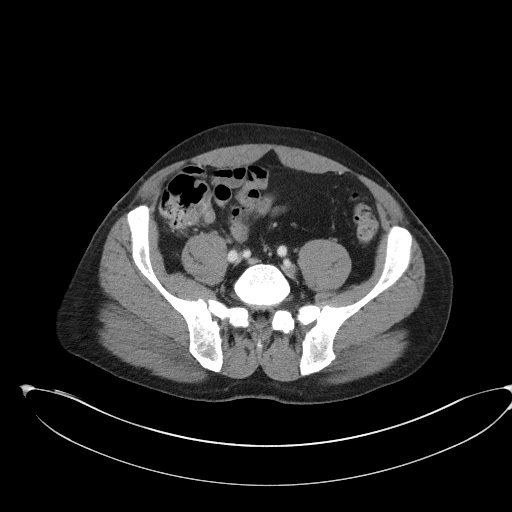
[im 58/111  soft-tissue]
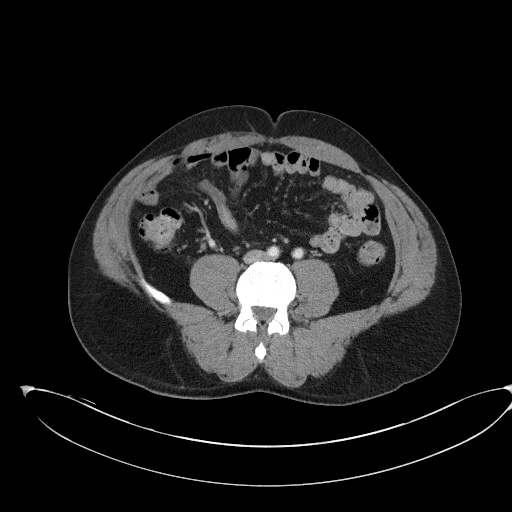
[im 64/111  soft-tissue]
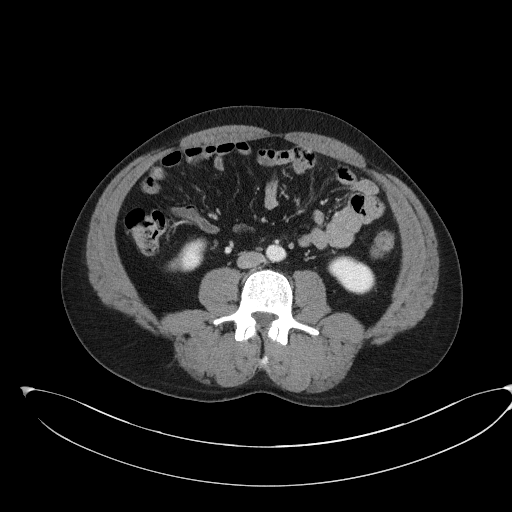
[im 70/111  soft-tissue]
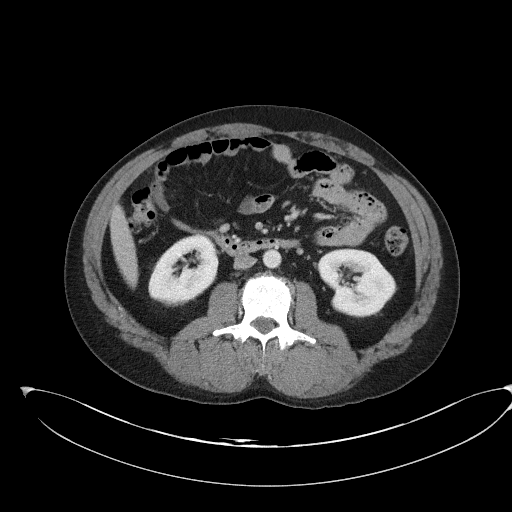
[im 70/111  bone]
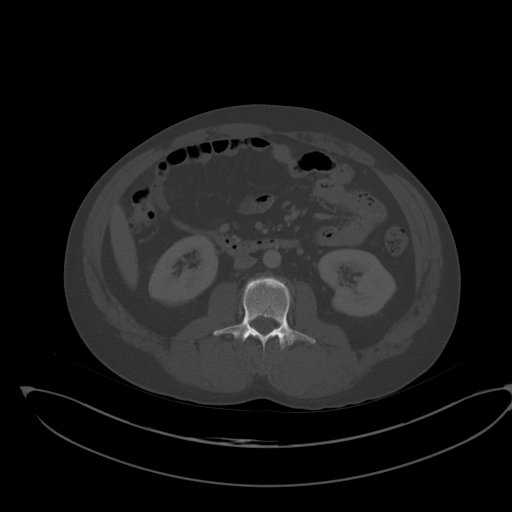
[im 82/111  soft-tissue]
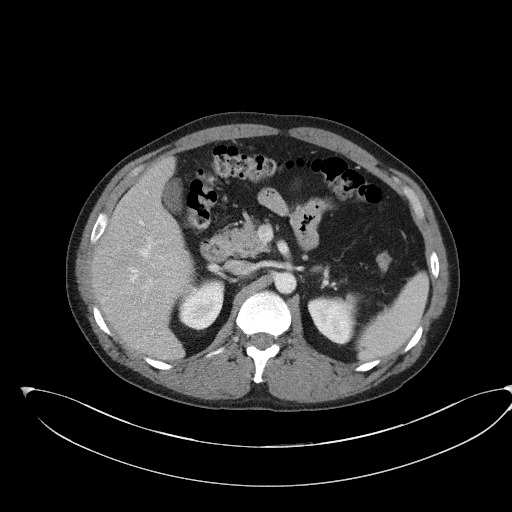
[im 87/111  soft-tissue]
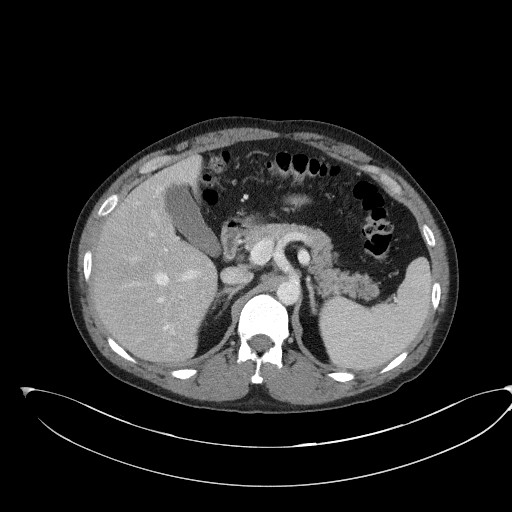
[im 93/111  soft-tissue]
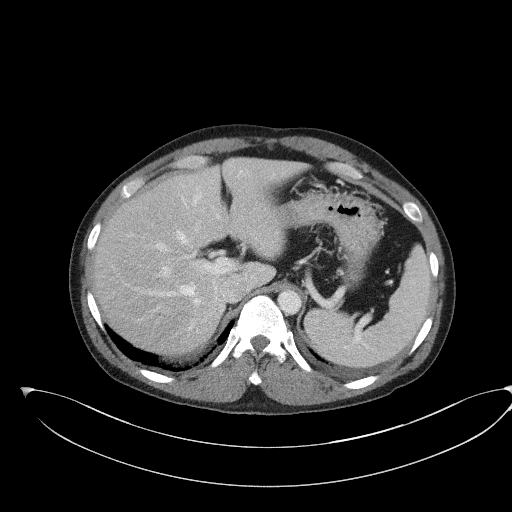
[im 105/111  soft-tissue]
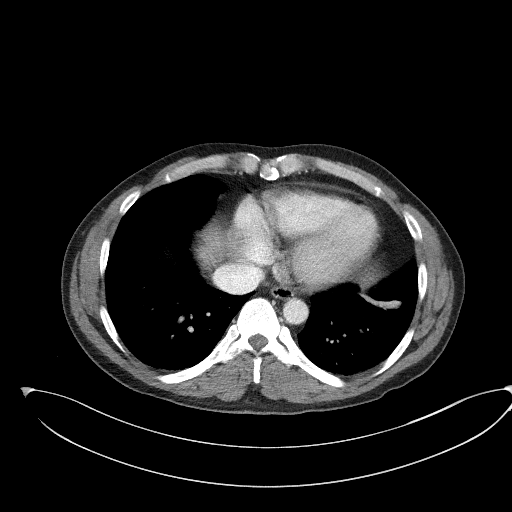

[Series 5: coronal st · coronal · 0.82mm/px · 3 of 92 slices shown]
[im 31/92  soft-tissue]
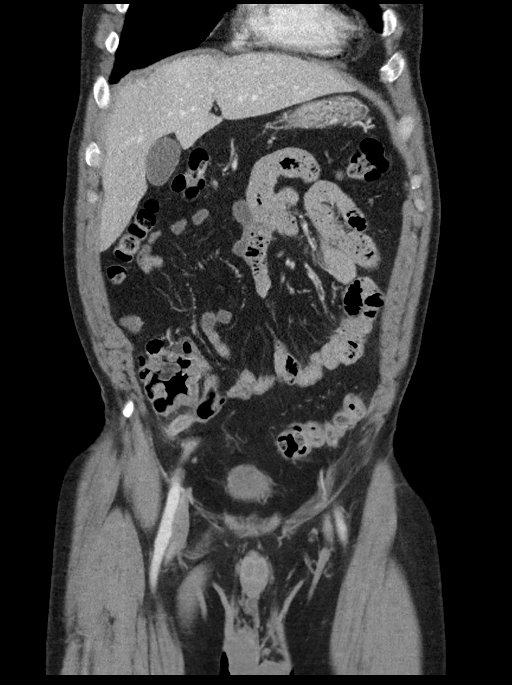
[im 41/92  soft-tissue]
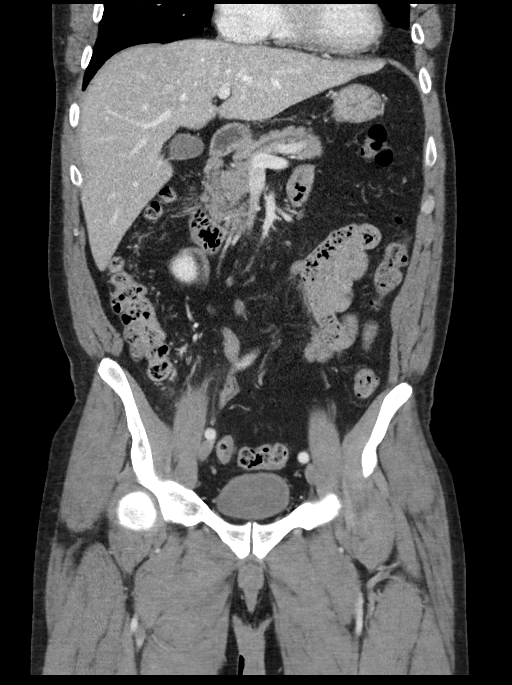
[im 51/92  soft-tissue]
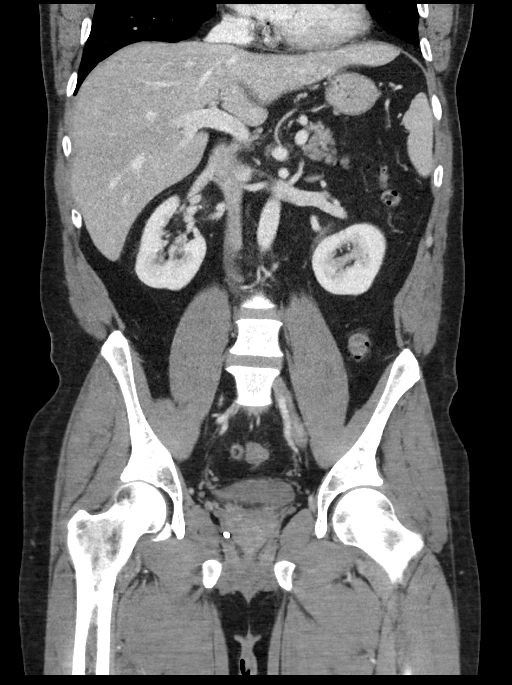

[16 of 46 positions shown; findings below may reference images not displayed]

FINDINGS: Lower chest: Left base scarring. Normal heart size. Trace bilateral
pleural fluid may be physiologic.

Hepatobiliary: Normal liver. Normal gallbladder, without biliary
ductal dilatation.

Pancreas: Normal, without mass or ductal dilatation.

Spleen: Normal in size, without focal abnormality.

Adrenals/Urinary Tract: Normal adrenal glands. Normal kidneys,
without hydronephrosis. Normal urinary bladder.

Stomach/Bowel: Normal stomach, without wall thickening. Scattered
colonic diverticula. Normal terminal ileum. The appendix is enlarged
with mild surrounding inflammation. Appendicoliths within. Example
1.4 cm on image 70/2.

No free perforation or periappendiceal fluid collection. Normal
small bowel.

Vascular/Lymphatic: Normal caliber of the aorta and branch vessels.
No abdominopelvic adenopathy.

Reproductive: Normal prostate.

Other: No significant free fluid.

Musculoskeletal: Disc bulge at the lumbosacral junction.
IMPRESSION: 1. Non complicated appendicitis.
2. Trace bilateral pleural fluid, possibly physiologic.

These results will be called to the ordering clinician or
representative by the Radiologist Assistant, and communication
documented in the PACS or zVision Dashboard.

This exam was interpreted during a PACS downtime with limited
availability of comparison cases. It has been flagged for review
following the downtime. If clinically indicated after this review,
an addendum will be issued providing details about comparison to
prior imaging.

## 2019-06-03 ENCOUNTER — Encounter: Payer: Self-pay | Admitting: Family Medicine

## 2019-06-03 NOTE — Telephone Encounter (Signed)
Error

## 2019-09-13 ENCOUNTER — Other Ambulatory Visit: Payer: Self-pay | Admitting: Family Medicine

## 2019-10-12 ENCOUNTER — Encounter: Payer: Self-pay | Admitting: Family Medicine

## 2019-10-12 ENCOUNTER — Ambulatory Visit: Payer: BC Managed Care – PPO | Admitting: Family Medicine

## 2019-10-12 ENCOUNTER — Other Ambulatory Visit: Payer: Self-pay

## 2019-10-12 VITALS — BP 118/72 | HR 100 | Temp 96.9°F | Wt 187.0 lb

## 2019-10-12 DIAGNOSIS — E7800 Pure hypercholesterolemia, unspecified: Secondary | ICD-10-CM | POA: Diagnosis not present

## 2019-10-12 DIAGNOSIS — F411 Generalized anxiety disorder: Secondary | ICD-10-CM

## 2019-10-12 LAB — LIPID PANEL
Cholesterol: 196 mg/dL (ref 0–200)
HDL: 35.1 mg/dL — ABNORMAL LOW (ref >39.00)
LDL Cholesterol: 138 mg/dL — ABNORMAL HIGH (ref 0–99)
NonHDL: 161.15
Total CHOL/HDL Ratio: 6
Triglycerides: 118 mg/dL (ref 0.0–149.0)
VLDL: 23.6 mg/dL (ref 0.0–40.0)

## 2019-10-12 NOTE — Addendum Note (Signed)
Addended by: Ames Coupe on: 10/12/2019 07:24 AM   Modules accepted: Orders, Level of Service

## 2019-10-12 NOTE — Progress Notes (Addendum)
CC: Med ck  Subjective Xavier Gonzalez presents for f/u anxiety/depression.  He is currently being treated with Prozac 10 mg/d.  Reports doing well. Still having sexual AE's.  No thoughts of harming self or others. No self-medication with alcohol, prescription drugs or illicit drugs. Pt is not following with a counselor/psychologist.  Elevated cholesterol. Diet/exercise did not change. No CAD.  ROS Psych: No homicidal or suicidal thoughts Cardiac: No chest pain  Past Medical History:  Diagnosis Date  . GAD (generalized anxiety disorder)   . Gilbert's disease   . History of chicken pox      Exam BP 118/72 (BP Location: Left Arm, Patient Position: Sitting, Cuff Size: Normal)   Pulse 100   Temp (!) 96.9 F (36.1 C) (Temporal)   Wt 187 lb (84.8 kg)   SpO2 96%   BMI 25.36 kg/m  General:  well developed, well nourished, in no apparent distress Neck: neck supple without adenopathy, thyromegaly, or masses Lungs:  clear to auscultation, breath sounds equal bilaterally, no respiratory distress Cardio:  regular rate and rhythm without murmurs, heart sounds without clicks or rubs Psych: well oriented with normal range of affect and age-appropriate judgement/insight, alert and oriented x4.  Assessment and Plan  Generalized anxiety disorder  Pure hypercholesterolemia - Plan: Lipid panel  Cont prozac for now. Could consider BuSpar if he decides to change, which he decides not to at this time.  F/u in 6 mo. The patient voiced understanding and agreement to the plan.  [place], DO 10/12/19 7:24 AM

## 2019-10-12 NOTE — Patient Instructions (Addendum)
If you change your mind about medication adjustment, send me a message. I am thinking of changing your medicine to BuSpar (twice daily) if you decide.   Let us know if you need anything.

## 2020-03-06 ENCOUNTER — Other Ambulatory Visit: Payer: Self-pay | Admitting: Family Medicine

## 2020-04-18 ENCOUNTER — Ambulatory Visit: Payer: BC Managed Care – PPO | Admitting: Family Medicine

## 2020-04-18 ENCOUNTER — Other Ambulatory Visit: Payer: Self-pay

## 2020-04-18 ENCOUNTER — Other Ambulatory Visit: Payer: Self-pay | Admitting: Family Medicine

## 2020-04-18 ENCOUNTER — Encounter: Payer: Self-pay | Admitting: Family Medicine

## 2020-04-18 VITALS — BP 118/78 | HR 99 | Temp 98.1°F | Ht 72.0 in | Wt 182.0 lb

## 2020-04-18 DIAGNOSIS — Z1159 Encounter for screening for other viral diseases: Secondary | ICD-10-CM | POA: Diagnosis not present

## 2020-04-18 DIAGNOSIS — E7800 Pure hypercholesterolemia, unspecified: Secondary | ICD-10-CM

## 2020-04-18 DIAGNOSIS — Z0000 Encounter for general adult medical examination without abnormal findings: Secondary | ICD-10-CM

## 2020-04-18 DIAGNOSIS — Z125 Encounter for screening for malignant neoplasm of prostate: Secondary | ICD-10-CM | POA: Diagnosis not present

## 2020-04-18 LAB — COMPREHENSIVE METABOLIC PANEL
ALT: 29 U/L (ref 0–53)
AST: 19 U/L (ref 0–37)
Albumin: 4.2 g/dL (ref 3.5–5.2)
Alkaline Phosphatase: 79 U/L (ref 39–117)
BUN: 20 mg/dL (ref 6–23)
CO2: 29 mEq/L (ref 19–32)
Calcium: 9 mg/dL (ref 8.4–10.5)
Chloride: 105 mEq/L (ref 96–112)
Creatinine, Ser: 1 mg/dL (ref 0.40–1.50)
GFR: 78.45 mL/min (ref >60.00)
Glucose, Bld: 104 mg/dL — ABNORMAL HIGH (ref 70–99)
Potassium: 4.3 mEq/L (ref 3.5–5.1)
Sodium: 140 mEq/L (ref 135–145)
Total Bilirubin: 1.7 mg/dL — ABNORMAL HIGH (ref 0.2–1.2)
Total Protein: 6.5 g/dL (ref 6.0–8.3)

## 2020-04-18 LAB — LIPID PANEL
Cholesterol: 216 mg/dL — ABNORMAL HIGH (ref 0–200)
HDL: 40.5 mg/dL (ref >39.00)
LDL Cholesterol: 149 mg/dL — ABNORMAL HIGH (ref 0–99)
NonHDL: 175.8
Total CHOL/HDL Ratio: 5
Triglycerides: 133 mg/dL (ref 0.0–149.0)
VLDL: 26.6 mg/dL (ref 0.0–40.0)

## 2020-04-18 LAB — CBC
HCT: 44.9 % (ref 39.0–52.0)
Hemoglobin: 15.7 g/dL (ref 13.0–17.0)
MCHC: 35 g/dL (ref 30.0–36.0)
MCV: 91.1 fl (ref 78.0–100.0)
Platelets: 242 10*3/uL (ref 150.0–400.0)
RBC: 4.93 Mil/uL (ref 4.22–5.81)
RDW: 12.7 % (ref 11.5–15.5)
WBC: 5.4 10*3/uL (ref 4.0–10.5)

## 2020-04-18 LAB — PSA: PSA: 0.62 ng/mL (ref 0.10–4.00)

## 2020-04-18 MED ORDER — ROSUVASTATIN CALCIUM 20 MG PO TABS: 20.0000 mg | ORAL_TABLET | Freq: Every day | ORAL | 3 refills | Status: DC

## 2020-04-18 NOTE — Progress Notes (Signed)
Chief Complaint  Patient presents with  . Annual Exam    Well Male Xavier Gonzalez is here for a complete physical.   His last physical was >1 year ago.  Current diet: in general, a "healthy" diet.  Current exercise: golf Weight trend: stable Fatigue out of ordinary? No. Seat belt? Yes.    Health maintenance Shingrix- No Colonoscopy- Yes Tetanus- Yes HIV- Yes Hep C- No   Past Medical History:  Diagnosis Date  . GAD (generalized anxiety disorder)   . Gilbert's disease   . History of chicken pox       Past Surgical History:  Procedure Laterality Date  . FINGER SURGERY  08/2003  . HERNIA REPAIR    . LAPAROSCOPIC APPENDECTOMY N/A 11/19/2018   Procedure: APPENDECTOMY LAPAROSCOPIC;  Surgeon: Jovita Kussmaul, MD;  Location: [facility];  Service: General;  Laterality: N/A;  . TONSILLECTOMY AND ADENOIDECTOMY Bilateral     Medications  Current Outpatient Medications on File Prior to Visit  Medication Sig Dispense Refill  . FLUoxetine (PROZAC) 10 MG tablet TAKE 1 TABLET BY MOUTH EVERY DAY 90 tablet 1  . L-Lysine 500 MG TABS Take 1,000 mg by mouth daily.     Allergies No Known Allergies  Family History Family History  Problem Relation Age of Onset  . Diabetes Mother   . COPD Mother   . Hypertension Father   . Anxiety disorder Daughter   . Diabetes Maternal Grandmother   . COPD Maternal Grandmother   . Cancer Maternal Uncle   . Colon cancer Neg Hx   . Colon polyps Neg Hx   . Esophageal cancer Neg Hx   . Rectal cancer Neg Hx   . Stomach cancer Neg Hx     Review of Systems: Constitutional:  no fevers Eye:  no recent significant change in vision Ear/Nose/Mouth/Throat:  Ears:  no hearing loss Nose/Mouth/Throat:  no complaints of nasal congestion, no sore throat Cardiovascular:  no chest pain Respiratory:  no shortness of breath Gastrointestinal:  no change in bowel habits GU:  Male: negative for dysuria, frequency Musculoskeletal/Extremities:  no joint  pain Integumentary (Skin/Breast):  no abnormal skin lesions reported Neurologic:  no headaches Endocrine: No unexpected weight changes Hematologic/Lymphatic:  no abnormal bleeding  Exam BP 118/78 (BP Location: Left Arm, Patient Position: Sitting, Cuff Size: Normal)   Pulse 99   Temp 98.1 F (36.7 C) (Oral)   Ht 6' (1.829 m)   Wt 182 lb (82.6 kg)   SpO2 98%   BMI 24.68 kg/m  General:  well developed, well nourished, in no apparent distress Skin:  no significant moles, warts, or growths Head:  no masses, lesions, or tenderness Eyes:  pupils equal and round, sclera anicteric without injection Ears:  canals without lesions, TMs shiny without retraction, no obvious effusion, no erythema Nose:  nares patent, septum midline, mucosa normal Throat/Pharynx:  lips and gingiva without lesion; tongue and uvula midline; non-inflamed pharynx; no exudates or postnasal drainage Neck: neck supple without adenopathy, thyromegaly, or masses Cardiac: RRR, no bruits, no LE edema Lungs:  clear to auscultation, breath sounds equal bilaterally, no respiratory distress Rectal: Deferred Musculoskeletal:  symmetrical muscle groups noted without atrophy or deformity Neuro:  gait normal; deep tendon reflexes normal and symmetric Psych: well oriented with normal range of affect and appropriate judgment/insight  Assessment and Plan  Well adult exam - Plan: CBC, Comprehensive metabolic panel, Lipid panel  Screening for prostate cancer - Plan: PSA  Encounter for hepatitis C screening test  for low risk patient - Plan: Hepatitis C antibody   Well 54 y.o. male. Counseled on diet and exercise. Counseled on risks and benefits of prostate cancer screening with PSA. The patient agrees to undergo testing. Immunizations, labs, and further orders as above. Follow up in 6 mo. The patient voiced understanding and agreement to the plan.  [place], DO 04/18/20 7:35 AM

## 2020-04-18 NOTE — Patient Instructions (Addendum)
Give us 2-3 business days to get the results of your labs back.   Keep the diet clean and stay active.  I recommend getting the flu shot in mid October. This suggestion would change if the CDC comes out with a different recommendation.   The new Shingrix vaccine (for shingles) is a 2 shot series. It can make people feel low energy, achy and almost like they have the flu for 48 hours after injection. Please plan accordingly when deciding on when to get this shot. Call our office for a nurse visit appointment to get this. The second shot of the series is less severe regarding the side effects, but it still lasts 48 hours.   Let us know if you need anything. 

## 2020-04-19 ENCOUNTER — Other Ambulatory Visit: Payer: Self-pay | Admitting: Family Medicine

## 2020-04-19 DIAGNOSIS — E7800 Pure hypercholesterolemia, unspecified: Secondary | ICD-10-CM

## 2020-04-19 LAB — HEPATITIS C ANTIBODY
Hepatitis C Ab: NONREACTIVE
SIGNAL TO CUT-OFF: 0.01 (ref <1.00)

## 2020-04-19 NOTE — Progress Notes (Signed)
hepati 

## 2020-05-31 ENCOUNTER — Other Ambulatory Visit (INDEPENDENT_AMBULATORY_CARE_PROVIDER_SITE_OTHER): Payer: BC Managed Care – PPO

## 2020-05-31 ENCOUNTER — Other Ambulatory Visit: Payer: Self-pay

## 2020-05-31 DIAGNOSIS — E7800 Pure hypercholesterolemia, unspecified: Secondary | ICD-10-CM

## 2020-05-31 LAB — HEPATIC FUNCTION PANEL
AG Ratio: 2.1 (calc) (ref 1.0–2.5)
ALT: 26 U/L (ref 9–46)
AST: 18 U/L (ref 10–35)
Albumin: 4.1 g/dL (ref 3.6–5.1)
Alkaline phosphatase (APISO): 80 U/L (ref 35–144)
Bilirubin, Direct: 0.3 mg/dL — ABNORMAL HIGH (ref 0.0–0.2)
Globulin: 2 g/dL (calc) (ref 1.9–3.7)
Indirect Bilirubin: 1.1 mg/dL (calc) (ref 0.2–1.2)
Total Bilirubin: 1.4 mg/dL — ABNORMAL HIGH (ref 0.2–1.2)
Total Protein: 6.1 g/dL (ref 6.1–8.1)

## 2020-05-31 LAB — LIPID PANEL
Cholesterol: 128 mg/dL (ref <200)
HDL: 37 mg/dL — ABNORMAL LOW (ref >=40)
LDL Cholesterol (Calc): 73 mg/dL (calc)
Non-HDL Cholesterol (Calc): 91 mg/dL (calc) (ref <130)
Total CHOL/HDL Ratio: 3.5 (calc) (ref <5.0)
Triglycerides: 98 mg/dL (ref <150)

## 2020-05-31 NOTE — Addendum Note (Signed)
Addended by: Kelle Darting A on: 05/31/2020 07:23 AM   Modules accepted: Orders

## 2020-08-25 ENCOUNTER — Other Ambulatory Visit: Payer: Self-pay | Admitting: Family Medicine

## 2020-09-25 ENCOUNTER — Ambulatory Visit (HOSPITAL_COMMUNITY): Payer: Self-pay

## 2020-09-25 ENCOUNTER — Telehealth (INDEPENDENT_AMBULATORY_CARE_PROVIDER_SITE_OTHER): Payer: BC Managed Care – PPO | Admitting: Family Medicine

## 2020-09-25 ENCOUNTER — Encounter: Payer: Self-pay | Admitting: Family Medicine

## 2020-09-25 VITALS — Temp 97.4°F | Wt 185.0 lb

## 2020-09-25 DIAGNOSIS — U071 COVID-19: Secondary | ICD-10-CM | POA: Diagnosis not present

## 2020-09-25 MED ORDER — PREDNISONE 20 MG PO TABS: 40.0000 mg | ORAL_TABLET | Freq: Every day | ORAL | 0 refills | Status: AC

## 2020-09-25 NOTE — Progress Notes (Signed)
Chief Complaint  Patient presents with  . Sore Throat    Body aches, fever, ear pain x Friday- pt suspects strep    Xavier Gonzalez Number here for URI complaints. Due to COVID-19 pandemic, we are interacting via web portal for an electronic face-to-face visit. I verified patient's ID using 2 identifiers. Patient agreed to proceed with visit via this method. Patient is at home, I am at home. Patient and I are present for visit.   Duration: 3 days  Associated symptoms: subjective fever, sore throat, slight drainage, ear pain, congestion and myalgia Denies: sinus pain, itchy watery eyes, ear drainage, wheezing, shortness of breath and N/V/D Treatment to date: Tylenol, ibuprofen Sick contacts: No  He is vaccinated.  He tested positive for covid at home yesterda.   Past Medical History:  Diagnosis Date  . GAD (generalized anxiety disorder)   . Gilbert's disease   . History of chicken pox     Temp (!) 97.4 F (36.3 C) (Oral)   Wt 185 lb (83.9 kg)   BMI 25.09 kg/m  No conversational dyspnea Age appropriate judgment and insight Nml affect and mood  COVID-19 - Plan: predniSONE (DELTASONE) 20 MG tablet  5 d pred burst to help with ears and offer alt anti-inflammatory to NSAIDs. Continue to push fluids, practice good hand hygiene, cover mouth when coughing. F/u prn. If starting to experience irreplaceable fluid loss, shaking, or shortness of breath, seek immediate care. Pt voiced understanding and agreement to the plan.  [place], DO 09/25/20 10:41 AM

## 2020-10-18 ENCOUNTER — Encounter: Payer: Self-pay | Admitting: Family Medicine

## 2020-10-20 ENCOUNTER — Encounter: Payer: Self-pay | Admitting: Family Medicine

## 2020-10-20 ENCOUNTER — Ambulatory Visit: Payer: BC Managed Care – PPO | Admitting: Family Medicine

## 2020-10-20 ENCOUNTER — Other Ambulatory Visit: Payer: Self-pay

## 2020-10-20 VITALS — BP 112/76 | HR 86 | Temp 98.0°F | Ht 72.0 in | Wt 191.0 lb

## 2020-10-20 DIAGNOSIS — F411 Generalized anxiety disorder: Secondary | ICD-10-CM

## 2020-10-20 DIAGNOSIS — E7800 Pure hypercholesterolemia, unspecified: Secondary | ICD-10-CM

## 2020-10-20 LAB — COMPREHENSIVE METABOLIC PANEL
ALT: 25 U/L (ref 0–53)
AST: 18 U/L (ref 0–37)
Albumin: 4.2 g/dL (ref 3.5–5.2)
Alkaline Phosphatase: 82 U/L (ref 39–117)
BUN: 16 mg/dL (ref 6–23)
CO2: 31 mEq/L (ref 19–32)
Calcium: 9.1 mg/dL (ref 8.4–10.5)
Chloride: 104 mEq/L (ref 96–112)
Creatinine, Ser: 1 mg/dL (ref 0.40–1.50)
GFR: 86.5 mL/min (ref >60.00)
Glucose, Bld: 106 mg/dL — ABNORMAL HIGH (ref 70–99)
Potassium: 4.2 mEq/L (ref 3.5–5.1)
Sodium: 140 mEq/L (ref 135–145)
Total Bilirubin: 1.7 mg/dL — ABNORMAL HIGH (ref 0.2–1.2)
Total Protein: 6.7 g/dL (ref 6.0–8.3)

## 2020-10-20 LAB — LIPID PANEL
Cholesterol: 122 mg/dL (ref 0–200)
HDL: 35.9 mg/dL — ABNORMAL LOW (ref >39.00)
LDL Cholesterol: 70 mg/dL (ref 0–99)
NonHDL: 86.31
Total CHOL/HDL Ratio: 3
Triglycerides: 83 mg/dL (ref 0.0–149.0)
VLDL: 16.6 mg/dL (ref 0.0–40.0)

## 2020-10-20 NOTE — Progress Notes (Signed)
Chief Complaint  Patient presents with  . Follow-up    Subjective Xavier Gonzalez presents for f/u anxiety/depression.  Pt is currently being treated with Prozac 5 mg/d.  Reports continuing to do well since treatment. No thoughts of harming self or others. No self-medication with alcohol, prescription drugs or illicit drugs. Pt is not following with a counselor/psychologist.  Hyperlipidemia Patient presents for hyperlipidemia follow up. Currently being treated with Crestor 20 mg/d and compliance with treatment thus far has been good. He denies myalgias. He is usually adhering to a healthy diet. Exercise: walking, golf The patient is not known to have coexisting coronary artery disease. No CP or SOB.   Past Medical History:  Diagnosis Date  . GAD (generalized anxiety disorder)   . Gilbert's disease   . History of chicken pox    Allergies as of 10/20/2020   No Known Allergies     Medication List       Accurate as of October 20, 2020  7:17 AM. If you have any questions, ask your nurse or doctor.        FLUoxetine 10 MG tablet Commonly known as: PROZAC TAKE 1 TABLET BY MOUTH EVERY DAY   L-Lysine 500 MG Tabs Take 1,000 mg by mouth daily.   rosuvastatin 20 MG tablet Commonly known as: Crestor Take 1 tablet (20 mg total) by mouth daily.       Exam BP 112/76 (BP Location: Left Arm, Patient Position: Sitting, Cuff Size: Normal)   Pulse 86   Temp 98 F (36.7 C) (Oral)   Ht 6' (1.829 m)   Wt 191 lb (86.6 kg)   SpO2 96%   BMI 25.90 kg/m  General:  well developed, well nourished, in no apparent distress Lungs:  No respiratory distress Psych: well oriented with normal range of affect and age-appropriate judgement/insight, alert and oriented x4.  Assessment and Plan  Generalized anxiety disorder  Pure hypercholesterolemia - Plan: Lipid panel, Comprehensive metabolic panel  1. Cont Prozac 5 mg/d. Doing well. 2. Cont Crestor 20 mg/d. Doing well also. Counseled on  diet/exercise.  F/u in 6 mo for CPE or prn. The patient voiced understanding and agreement to the plan.  [place], DO 10/20/20 7:17 AM

## 2020-10-20 NOTE — Patient Instructions (Addendum)
Give us 2-3 business days to get the results of your labs back.   Keep the diet clean and stay active.  Aim to do some physical exertion for 150 minutes per week. This is typically divided into 5 days per week, 30 minutes per day. The activity should be enough to get your heart rate up. Anything is better than nothing if you have time constraints.  Let us know if you need anything.  

## 2021-01-29 ENCOUNTER — Telehealth (INDEPENDENT_AMBULATORY_CARE_PROVIDER_SITE_OTHER): Payer: BC Managed Care – PPO | Admitting: Family Medicine

## 2021-01-29 ENCOUNTER — Encounter: Payer: Self-pay | Admitting: Family Medicine

## 2021-01-29 DIAGNOSIS — J0100 Acute maxillary sinusitis, unspecified: Secondary | ICD-10-CM

## 2021-01-29 MED ORDER — PREDNISONE 20 MG PO TABS: 40.0000 mg | ORAL_TABLET | Freq: Every day | ORAL | 0 refills | Status: AC

## 2021-01-29 NOTE — Progress Notes (Signed)
Chief Complaint  Patient presents with  . Headache    Congestion   . Sinus Problem    Taken 2 covid test and both negative    Raelyn Number here for URI complaints. Due to COVID-19 pandemic, we are interacting via web portal for an electronic face-to-face visit. I verified patient's ID using 2 identifiers. Patient agreed to proceed with visit via this method. Patient is in parked car, I am at office. Patient and I are present for visit.   Duration: 4 days  Associated symptoms: sinus congestion, sinus pain, rhinorrhea and itchy watery eyes Denies: ear pain, ear drainage, sore throat, wheezing, shortness of breath, myalgia and fevers, cough Treatment to date: sinus rinses, Sudafed Sick contacts: No  Tested neg for covid.   Past Medical History:  Diagnosis Date  . GAD (generalized anxiety disorder)   . Gilbert's disease   . History of chicken pox    Exam No conversational dyspnea Age appropriate judgment and insight Nml affect and mood  Acute maxillary sinusitis, recurrence not specified - Plan: predniSONE (DELTASONE) 20 MG tablet  Likely viral or allergic. Send message at end of week if no better w 5 d pred burst 40 mg/d. Continue to push fluids, practice good hand hygiene, cover mouth when coughing. F/u prn. If starting to experience fevers, shaking, or shortness of breath, seek immediate care. Pt voiced understanding and agreement to the plan.  [place], DO 01/29/21 11:27 AM

## 2021-02-01 ENCOUNTER — Other Ambulatory Visit: Payer: Self-pay | Admitting: Family Medicine

## 2021-02-01 MED ORDER — DOXYCYCLINE HYCLATE 100 MG PO TABS: 100.0000 mg | ORAL_TABLET | Freq: Two times a day (BID) | ORAL | 0 refills | Status: AC

## 2021-03-27 ENCOUNTER — Other Ambulatory Visit: Payer: Self-pay | Admitting: Family Medicine

## 2021-04-20 ENCOUNTER — Encounter: Payer: Self-pay | Admitting: Family Medicine

## 2021-04-20 ENCOUNTER — Other Ambulatory Visit: Payer: Self-pay

## 2021-04-20 ENCOUNTER — Ambulatory Visit (INDEPENDENT_AMBULATORY_CARE_PROVIDER_SITE_OTHER): Payer: BC Managed Care – PPO | Admitting: Family Medicine

## 2021-04-20 VITALS — BP 108/62 | HR 81 | Temp 97.7°F | Ht 72.0 in | Wt 187.4 lb

## 2021-04-20 DIAGNOSIS — Z0000 Encounter for general adult medical examination without abnormal findings: Secondary | ICD-10-CM | POA: Diagnosis not present

## 2021-04-20 DIAGNOSIS — Z125 Encounter for screening for malignant neoplasm of prostate: Secondary | ICD-10-CM | POA: Diagnosis not present

## 2021-04-20 LAB — CBC
HCT: 45.4 % (ref 39.0–52.0)
Hemoglobin: 15.9 g/dL (ref 13.0–17.0)
MCHC: 35 g/dL (ref 30.0–36.0)
MCV: 90.4 fl (ref 78.0–100.0)
Platelets: 211 10*3/uL (ref 150.0–400.0)
RBC: 5.03 Mil/uL (ref 4.22–5.81)
RDW: 12.6 % (ref 11.5–15.5)
WBC: 5.5 10*3/uL (ref 4.0–10.5)

## 2021-04-20 LAB — COMPREHENSIVE METABOLIC PANEL
ALT: 35 U/L (ref 0–53)
AST: 23 U/L (ref 0–37)
Albumin: 4.2 g/dL (ref 3.5–5.2)
Alkaline Phosphatase: 77 U/L (ref 39–117)
BUN: 18 mg/dL (ref 6–23)
CO2: 28 mEq/L (ref 19–32)
Calcium: 8.8 mg/dL (ref 8.4–10.5)
Chloride: 106 mEq/L (ref 96–112)
Creatinine, Ser: 0.93 mg/dL (ref 0.40–1.50)
GFR: 94.04 mL/min (ref >60.00)
Glucose, Bld: 105 mg/dL — ABNORMAL HIGH (ref 70–99)
Potassium: 4.2 mEq/L (ref 3.5–5.1)
Sodium: 141 mEq/L (ref 135–145)
Total Bilirubin: 2 mg/dL — ABNORMAL HIGH (ref 0.2–1.2)
Total Protein: 6.2 g/dL (ref 6.0–8.3)

## 2021-04-20 LAB — LIPID PANEL
Cholesterol: 134 mg/dL (ref 0–200)
HDL: 39.4 mg/dL (ref >39.00)
LDL Cholesterol: 74 mg/dL (ref 0–99)
NonHDL: 94.91
Total CHOL/HDL Ratio: 3
Triglycerides: 105 mg/dL (ref 0.0–149.0)
VLDL: 21 mg/dL (ref 0.0–40.0)

## 2021-04-20 LAB — PSA: PSA: 0.92 ng/mL (ref 0.10–4.00)

## 2021-04-20 NOTE — Patient Instructions (Addendum)
Give [place] 2-3 business days to get the results of your labs back.   Keep the diet clean and stay active.  The new Shingrix vaccine (for shingles) is a 2 shot series. It can make people feel low energy, achy and almost like they have the flu for 48 hours after injection. Please plan accordingly when deciding on when to get this shot. Call our office for a nurse visit appointment to get this. The second shot of the series is less severe regarding the side effects, but it still lasts 48 hours.   I recommend getting the flu shot in mid October. This suggestion would change if the CDC comes out with a different recommendation.   Have your wife keep an eye on the area in the back of your throat. There is a slight enlargement on your right side. If it goes away in the next month, great. If not, I would like to know.   Let us know if you need anything.

## 2021-04-20 NOTE — Progress Notes (Signed)
Chief Complaint  Patient presents with   Annual Exam    Well Male Xavier Gonzalez is here for a complete physical.   His last physical was >1 year ago.  Current diet: in general, a "pretty healthy" diet.  Current exercise: walking, golf Weight trend: stable Fatigue out of ordinary? No. Seat belt? Yes.    Health maintenance Shingrix- No Colonoscopy- Yes Tetanus- Yes HIV- Yes Hep C- Yes   Past Medical History:  Diagnosis Date   GAD (generalized anxiety disorder)    Gilbert's disease    History of chicken pox     Past Surgical History:  Procedure Laterality Date   FINGER SURGERY  08/2003   HERNIA REPAIR     LAPAROSCOPIC APPENDECTOMY N/A 11/19/2018   Procedure: APPENDECTOMY LAPAROSCOPIC;  Surgeon: Autumn Messing III, MD;  Location: [facility];  Service: General;  Laterality: N/A;   TONSILLECTOMY AND ADENOIDECTOMY Bilateral     Medications  Current Outpatient Medications on File Prior to Visit  Medication Sig Dispense Refill   FLUoxetine (PROZAC) 10 MG tablet TAKE 1 TABLET BY MOUTH EVERY DAY 90 tablet 1   L-Lysine 500 MG TABS Take 1,000 mg by mouth daily.     rosuvastatin (CRESTOR) 20 MG tablet TAKE 1 TABLET BY MOUTH EVERY DAY 90 tablet 3   Allergies No Known Allergies  Family History Family History  Problem Relation Age of Onset   Diabetes Mother    COPD Mother    Hypertension Father    Anxiety disorder Daughter    Diabetes Maternal Grandmother    COPD Maternal Grandmother    Cancer Maternal Uncle    Colon cancer Neg Hx    Colon polyps Neg Hx    Esophageal cancer Neg Hx    Rectal cancer Neg Hx    Stomach cancer Neg Hx     Review of Systems: Constitutional:  no fevers Eye:  no recent significant change in vision Ear/Nose/Mouth/Throat:  Ears:  no hearing loss Nose/Mouth/Throat:  no complaints of nasal congestion, no sore throat Cardiovascular:  no chest pain Respiratory:  no shortness of breath Gastrointestinal:  no change in bowel habits GU:  Male: negative for  dysuria, frequency Musculoskeletal/Extremities:  no joint pain Integumentary (Skin/Breast):  no abnormal skin lesions reported Neurologic:  no headaches Endocrine: No unexpected weight changes Hematologic/Lymphatic:  no abnormal bleeding  Exam BP 108/62   Pulse 81   Temp 97.7 F (36.5 C) (Oral)   Ht 6' (1.829 m)   Wt 187 lb 6 oz (85 kg)   SpO2 95%   BMI 25.41 kg/m  General:  well developed, well nourished, in no apparent distress Skin:  no significant moles, warts, or growths Head:  no masses, lesions, or tenderness Eyes:  pupils equal and round, sclera anicteric without injection Ears:  canals without lesions, TMs shiny without retraction, no obvious effusion, no erythema Nose:  nares patent, septum midline, mucosa normal Throat/Pharynx:  lips and gingiva without lesion; tongue and uvula midline; non-inflamed pharynx; Over the R portion of the pharynx, there is a flesh colored protrusion that is linear; no exudates or postnasal drainage Neck: neck supple without adenopathy, thyromegaly, or masses Cardiac: RRR, no bruits, no LE edema Lungs:  clear to auscultation, breath sounds equal bilaterally, no respiratory distress Abdomen: BS+, soft, non-tender, non-distended, no masses or organomegaly noted Rectal: Deferred Musculoskeletal:  symmetrical muscle groups noted without atrophy or deformity Neuro:  gait normal; deep tendon reflexes normal and symmetric Psych: well oriented with normal range of  affect and appropriate judgment/insight  Assessment and Plan  Well adult exam - Plan: Lipid panel, CBC, Comprehensive metabolic panel  Screening for prostate cancer - Plan: PSA   Well 55 y.o. male. Counseled on diet and exercise. Counseled on risks and benefits of prostate cancer screening with PSA. The patient agrees to undergo testing. There is a pharyngeal protrusion that I would like him to keep an eye on. He will notify his wife and monitor over the next month. If it is still  present in 1 mo, will refer to ENT. We will place a recall for 1 mo to reach out to him.  Immunizations, labs, and further orders as above. Rec'd flu shot in Oct, Shingrix and covid booster. Plans on flu shot, will think about the other 2.  Follow up in 6 mo or prn. The patient voiced understanding and agreement to the plan.  [place], DO 04/20/21 7:06 AM

## 2021-05-22 ENCOUNTER — Telehealth: Payer: Self-pay

## 2021-05-22 NOTE — Telephone Encounter (Signed)
[facility] - Client Client Site [facility] - Night Physician Riki Sheer- MD Contact Type Call Who Is Calling Patient / Member / Family / Caregiver Caller Name Fawzi Lavinder Phone Number [phone] or [phone] Call Type Message Only Information Provided Reason for Call Returning a Call from the Office Initial [place] states he was returning a call from the office. Disp. Time Disposition Final User 05/22/2021 8:04:35 AM General Information Provided Yes Josephine Cables Call Closed By: Josephine Cables Transaction Date/Time: 05/22/2021 8:02:00 AM (ET

## 2021-05-22 NOTE — Telephone Encounter (Signed)
Have spoken to the patient already.

## 2021-10-22 ENCOUNTER — Encounter: Payer: Self-pay | Admitting: Family Medicine

## 2021-10-22 ENCOUNTER — Ambulatory Visit (INDEPENDENT_AMBULATORY_CARE_PROVIDER_SITE_OTHER): Payer: BC Managed Care – PPO | Admitting: Family Medicine

## 2021-10-22 VITALS — BP 108/60 | HR 83 | Temp 98.0°F | Ht 71.0 in | Wt 190.2 lb

## 2021-10-22 DIAGNOSIS — E7800 Pure hypercholesterolemia, unspecified: Secondary | ICD-10-CM | POA: Diagnosis not present

## 2021-10-22 DIAGNOSIS — F411 Generalized anxiety disorder: Secondary | ICD-10-CM | POA: Diagnosis not present

## 2021-10-22 MED ORDER — FLUOXETINE HCL 10 MG PO TABS: 10.0000 mg | ORAL_TABLET | Freq: Every day | ORAL | 2 refills | Status: DC

## 2021-10-22 NOTE — Patient Instructions (Signed)
Give [place] 2-3 business days to get the results of your labs back.   Keep the diet clean and stay active.  I recommend getting the updated bivalent covid vaccination booster at your convenience.   Let us know if you need anything.

## 2021-10-22 NOTE — Progress Notes (Signed)
Chief Complaint  Patient presents with   Follow-up    6 month    Subjective Xavier Gonzalez presents for f/u anxiety.  Pt is currently being treated with Prozac 10 mg/d.  Reports doing well since treatment. No thoughts of harming self or others. No self-medication with alcohol, prescription drugs or illicit drugs. Pt is not following with a counselor/psychologist.  Hyperlipidemia Patient presents for hyperlipidemia follow up. Currently being treated with Crestor 20 mg/d and compliance with treatment thus far has been good. He denies myalgias. He is adhering to a healthy diet. Exercise: golfing, active at home The patient is not known to have coexisting coronary artery disease. No CP or SOB.   Past Medical History:  Diagnosis Date   GAD (generalized anxiety disorder)    Gilbert's disease    History of chicken pox    Allergies as of 10/22/2021   No Known Allergies      Medication List        Accurate as of October 22, 2021  7:02 AM. If you have any questions, ask your nurse or doctor.          FLUoxetine 10 MG tablet Commonly known as: PROZAC TAKE 1 TABLET BY MOUTH EVERY DAY   L-Lysine 500 MG Tabs Take 1,000 mg by mouth daily.   rosuvastatin 20 MG tablet Commonly known as: CRESTOR TAKE 1 TABLET BY MOUTH EVERY DAY        Exam BP 108/60    Pulse 83    Temp 98 F (36.7 C) (Oral)    Ht 5\' 11"  (1.803 m)    Wt 190 lb 4 oz (86.3 kg)    SpO2 96%    BMI 26.53 kg/m  General:  well developed, well nourished, in no apparent distress Heart: RRR, no LE edema Lungs:  CTAB. No respiratory distress Psych: well oriented with normal range of affect and age-appropriate judgement/insight, alert and oriented x4.  Assessment and Plan  Generalized anxiety disorder  Pure hypercholesterolemia - Plan: Comprehensive metabolic panel, Lipid panel  Chronic, stable. Cont Prozac 10 mg/d.  Chronic, stable. Ck labs. Cont Crestor 20 mg/d. Counseled on diet/exercise.  Bivalent COVID  vaccination booster recommended.  F/u in 6 mo for CPE or prn. The patient voiced understanding and agreement to the plan.  [place], DO 10/22/21 7:02 AM

## 2021-10-23 ENCOUNTER — Telehealth: Payer: Self-pay | Admitting: Family Medicine

## 2021-10-23 ENCOUNTER — Other Ambulatory Visit: Payer: Self-pay | Admitting: Family Medicine

## 2021-10-23 DIAGNOSIS — E7800 Pure hypercholesterolemia, unspecified: Secondary | ICD-10-CM

## 2021-10-23 LAB — COMPREHENSIVE METABOLIC PANEL
ALT: 36 U/L (ref 0–53)
AST: 22 U/L (ref 0–37)
Albumin: 4.5 g/dL (ref 3.5–5.2)
Alkaline Phosphatase: 82 U/L (ref 39–117)
BUN: 19 mg/dL (ref 6–23)
CO2: 32 mEq/L (ref 19–32)
Calcium: 9.3 mg/dL (ref 8.4–10.5)
Chloride: 105 mEq/L (ref 96–112)
Creatinine, Ser: 1.07 mg/dL (ref 0.40–1.50)
GFR: 79.19 mL/min (ref >60.00)
Glucose, Bld: 124 mg/dL — ABNORMAL HIGH (ref 70–99)
Potassium: 4 mEq/L (ref 3.5–5.1)
Sodium: 141 mEq/L (ref 135–145)
Total Bilirubin: 1.9 mg/dL — ABNORMAL HIGH (ref 0.2–1.2)
Total Protein: 6.6 g/dL (ref 6.0–8.3)

## 2021-10-23 LAB — LIPID PANEL
Cholesterol: 151 mg/dL (ref 0–200)
HDL: 40.6 mg/dL (ref >39.00)
LDL Cholesterol: 75 mg/dL (ref 0–99)
NonHDL: 110
Total CHOL/HDL Ratio: 4
Triglycerides: 177 mg/dL — ABNORMAL HIGH (ref 0.0–149.0)
VLDL: 35.4 mg/dL (ref 0.0–40.0)

## 2021-10-23 NOTE — Telephone Encounter (Signed)
See result notes. 

## 2021-10-23 NOTE — Telephone Encounter (Signed)
Pt called to go over labs. Please advise.

## 2021-12-07 ENCOUNTER — Other Ambulatory Visit (INDEPENDENT_AMBULATORY_CARE_PROVIDER_SITE_OTHER): Payer: BC Managed Care – PPO

## 2021-12-07 DIAGNOSIS — E7800 Pure hypercholesterolemia, unspecified: Secondary | ICD-10-CM | POA: Diagnosis not present

## 2021-12-07 LAB — LIPID PANEL
Cholesterol: 129 mg/dL (ref 0–200)
HDL: 36.3 mg/dL — ABNORMAL LOW (ref >39.00)
LDL Cholesterol: 72 mg/dL (ref 0–99)
NonHDL: 92.68
Total CHOL/HDL Ratio: 4
Triglycerides: 105 mg/dL (ref 0.0–149.0)
VLDL: 21 mg/dL (ref 0.0–40.0)

## 2021-12-07 LAB — HEMOGLOBIN A1C: Hgb A1c MFr Bld: 5.6 % (ref 4.6–6.5)

## 2022-03-13 ENCOUNTER — Other Ambulatory Visit: Payer: Self-pay | Admitting: Family Medicine

## 2022-04-26 ENCOUNTER — Encounter: Payer: BC Managed Care – PPO | Admitting: Family Medicine

## 2022-04-29 ENCOUNTER — Ambulatory Visit (INDEPENDENT_AMBULATORY_CARE_PROVIDER_SITE_OTHER): Payer: BC Managed Care – PPO | Admitting: Family Medicine

## 2022-04-29 ENCOUNTER — Encounter: Payer: Self-pay | Admitting: Family Medicine

## 2022-04-29 VITALS — BP 108/68 | HR 84 | Temp 98.0°F | Ht 72.0 in | Wt 184.0 lb

## 2022-04-29 DIAGNOSIS — Z0000 Encounter for general adult medical examination without abnormal findings: Secondary | ICD-10-CM

## 2022-04-29 DIAGNOSIS — Z125 Encounter for screening for malignant neoplasm of prostate: Secondary | ICD-10-CM

## 2022-04-29 LAB — LIPID PANEL
Cholesterol: 124 mg/dL (ref 0–200)
HDL: 39.9 mg/dL (ref >39.00)
LDL Cholesterol: 66 mg/dL (ref 0–99)
NonHDL: 83.82
Total CHOL/HDL Ratio: 3
Triglycerides: 90 mg/dL (ref 0.0–149.0)
VLDL: 18 mg/dL (ref 0.0–40.0)

## 2022-04-29 LAB — COMPREHENSIVE METABOLIC PANEL
ALT: 34 U/L (ref 0–53)
AST: 27 U/L (ref 0–37)
Albumin: 4.3 g/dL (ref 3.5–5.2)
Alkaline Phosphatase: 82 U/L (ref 39–117)
BUN: 16 mg/dL (ref 6–23)
CO2: 29 mEq/L (ref 19–32)
Calcium: 9.1 mg/dL (ref 8.4–10.5)
Chloride: 105 mEq/L (ref 96–112)
Creatinine, Ser: 0.95 mg/dL (ref 0.40–1.50)
GFR: 91.01 mL/min (ref >60.00)
Glucose, Bld: 102 mg/dL — ABNORMAL HIGH (ref 70–99)
Potassium: 4.2 mEq/L (ref 3.5–5.1)
Sodium: 141 mEq/L (ref 135–145)
Total Bilirubin: 2.3 mg/dL — ABNORMAL HIGH (ref 0.2–1.2)
Total Protein: 6.5 g/dL (ref 6.0–8.3)

## 2022-04-29 LAB — CBC
HCT: 44.5 % (ref 39.0–52.0)
Hemoglobin: 15.6 g/dL (ref 13.0–17.0)
MCHC: 35 g/dL (ref 30.0–36.0)
MCV: 91.5 fl (ref 78.0–100.0)
Platelets: 246 10*3/uL (ref 150.0–400.0)
RBC: 4.87 Mil/uL (ref 4.22–5.81)
RDW: 12.5 % (ref 11.5–15.5)
WBC: 5 10*3/uL (ref 4.0–10.5)

## 2022-04-29 LAB — PSA: PSA: 0.79 ng/mL (ref 0.10–4.00)

## 2022-04-29 NOTE — Progress Notes (Signed)
Chief Complaint  Patient presents with   Annual Exam    Well Male Xavier Gonzalez is here for a complete physical.   His last physical was >1 year ago.  Current diet: in general, diet is fair.  Current exercise: golfing, active at work Weight trend: down a few lbs Fatigue out of ordinary? No. Seat belt? Yes.   Advanced directive? Yes  Health maintenance Shingrix- No Colonoscopy- Yes Tetanus- Yes HIV- Yes Hep C- Yes   Past Medical History:  Diagnosis Date   GAD (generalized anxiety disorder)    Gilbert's disease    History of chicken pox      Past Surgical History:  Procedure Laterality Date   FINGER SURGERY  08/2003   HERNIA REPAIR     LAPAROSCOPIC APPENDECTOMY N/A 11/19/2018   Procedure: APPENDECTOMY LAPAROSCOPIC;  Surgeon: Autumn Messing III, MD;  Location: [facility];  Service: General;  Laterality: N/A;   TONSILLECTOMY AND ADENOIDECTOMY Bilateral     Medications  Current Outpatient Medications on File Prior to Visit  Medication Sig Dispense Refill   FLUoxetine (PROZAC) 10 MG tablet Take 1 tablet (10 mg total) by mouth daily. 90 tablet 2   L-Lysine 500 MG TABS Take 1,000 mg by mouth daily.     rosuvastatin (CRESTOR) 20 MG tablet TAKE 1 TABLET BY MOUTH EVERY DAY 90 tablet 3   Allergies No Known Allergies  Family History Family History  Problem Relation Age of Onset   Diabetes Mother    COPD Mother    Hypertension Father    Anxiety disorder Daughter    Diabetes Maternal Grandmother    COPD Maternal Grandmother    Cancer Maternal Uncle    Colon cancer Neg Hx    Colon polyps Neg Hx    Esophageal cancer Neg Hx    Rectal cancer Neg Hx    Stomach cancer Neg Hx     Review of Systems: Constitutional:  no fevers Eye:  no recent significant change in vision Ear/Nose/Mouth/Throat:  Ears:  no hearing loss Nose/Mouth/Throat:  no complaints of nasal congestion, no sore throat Cardiovascular:  no chest pain Respiratory:  no shortness of breath Gastrointestinal:  no  change in bowel habits GU:  Male: negative for dysuria, frequency Musculoskeletal/Extremities: +chronic L shoulder pain Integumentary (Skin/Breast):  no abnormal skin lesions reported Neurologic:  no headaches Endocrine: No unexpected weight changes Hematologic/Lymphatic:  no abnormal bleeding  Exam BP 108/68   Pulse 84   Temp 98 F (36.7 C) (Oral)   Ht 6' (1.829 m)   Wt 184 lb (83.5 kg)   SpO2 96%   BMI 24.95 kg/m  General:  well developed, well nourished, in no apparent distress Skin:  no significant moles, warts, or growths Head:  no masses, lesions, or tenderness Eyes:  pupils equal and round, sclera anicteric without injection Ears:  canals without lesions, TMs shiny without retraction, no obvious effusion, no erythema Nose:  nares patent, septum midline, mucosa normal Throat/Pharynx:  lips and gingiva without lesion; tongue and uvula midline; non-inflamed pharynx; no exudates or postnasal drainage Neck: neck supple without adenopathy, thyromegaly, or masses Cardiac: RRR, no bruits, no LE edema Lungs:  clear to auscultation, breath sounds equal bilaterally, no respiratory distress Abdomen: BS+, soft, non-tender, non-distended, no masses or organomegaly noted Rectal: Deferred Musculoskeletal:  symmetrical muscle groups noted without atrophy or deformity Neuro:  gait normal; deep tendon reflexes normal and symmetric Psych: well oriented with normal range of affect and appropriate judgment/insight  Assessment and Plan  Well adult exam - Plan: CBC, Comprehensive metabolic panel, Lipid panel  Screening for prostate cancer - Plan: PSA   Well 56 y.o. male. Counseled on diet and exercise. Counseled on risks and benefits of prostate cancer screening with PSA. The patient agrees to undergo testing. Advanced directive form requested today.  Shingrix rec'd.  Immunizations, labs, and further orders as above. Follow up in 6 mo or prn. The patient voiced understanding and  agreement to the plan.  [place], DO 04/29/22 7:15 AM

## 2022-04-29 NOTE — Patient Instructions (Addendum)
Give [place] 2-3 business days to get the results of your labs back.   Keep the diet clean and stay active.  I recommend getting the flu shot in mid October. This suggestion would change if the CDC comes out with a different recommendation.   The Shingrix vaccine (for shingles) is a 2 shot series spaced 2-6 months apart. It can make people feel low energy, achy and almost like they have the flu for 48 hours after injection. 1/5 people can have nausea and/or vomiting. Please plan accordingly when deciding on when to get this shot. Call our office for a nurse visit appointment to get this. The second shot of the series is less severe regarding the side effects, but it still lasts 48 hours.   Please get me a copy of your advanced directive form at your convenience.   Let us know if you need anything.  EXERCISES  RANGE OF MOTION (ROM) AND STRETCHING EXERCISES These exercises may help you when beginning to rehabilitate your injury. While completing these exercises, remember:  Restoring tissue flexibility helps normal motion to return to the joints. This allows healthier, less painful movement and activity. An effective stretch should be held for at least 30 seconds. A stretch should never be painful. You should only feel a gentle lengthening or release in the stretched tissue.  ROM - Pendulum Bend at the waist so that your right / left arm falls away from your body. Support yourself with your opposite hand on a solid surface, such as a table or a countertop. Your right / left arm should be perpendicular to the ground. If it is not perpendicular, you need to lean over farther. Relax the muscles in your right / left arm and shoulder as much as possible. Gently sway your hips and trunk so they move your right / left arm without any use of your right / left shoulder muscles. Progress your movements so that your right / left arm moves side to side, then forward and backward, and finally, both clockwise and  counterclockwise. Complete 10-15 repetitions in each direction. Many people use this exercise to relieve discomfort in their shoulder as well as to gain range of motion. Repeat 2 times. Complete this exercise 3 times per week.  STRETCH - Flexion, Standing Stand with good posture. With an underhand grip on your right / left hand and an overhand grip on the opposite hand, grasp a broomstick or cane so that your hands are a little more than shoulder-width apart. Keeping your right / left elbow straight and shoulder muscles relaxed, push the stick with your opposite hand to raise your right / left arm in front of your body and then overhead. Raise your arm until you feel a stretch in your right / left shoulder, but before you have increased shoulder pain. Try to avoid shrugging your right / left shoulder as your arm rises by keeping your shoulder blade tucked down and toward your mid-back spine. Hold 30 seconds. Slowly return to the starting position. Repeat 2 times. Complete this exercise 3 times per week.  STRETCH - Internal Rotation Place your right / left hand behind your back, palm-up. Throw a towel or belt over your opposite shoulder. Grasp the towel/belt with your right / left hand. While keeping an upright posture, gently pull up on the towel/belt until you feel a stretch in the front of your right / left shoulder. Avoid shrugging your right / left shoulder as your arm rises by keeping your  shoulder blade tucked down and toward your mid-back spine. Hold 30. Release the stretch by lowering your opposite hand. Repeat 2 times. Complete this exercise 3 times per week.  STRETCH - External Rotation and Abduction Stagger your stance through a doorframe. It does not matter which foot is forward. As instructed by your physician, physical therapist or athletic trainer, place your hands: And forearms above your head and on the door frame. And forearms at head-height and on the door frame. At  elbow-height and on the door frame. Keeping your head and chest upright and your stomach muscles tight to prevent over-extending your low-back, slowly shift your weight onto your front foot until you feel a stretch across your chest and/or in the front of your shoulders. Hold 30 seconds. Shift your weight to your back foot to release the stretch. Repeat 2 times. Complete this stretch 3 times per week.   STRENGTHENING EXERCISES  These exercises may help you when beginning to rehabilitate your injury. They may resolve your symptoms with or without further involvement from your physician, physical therapist or athletic trainer. While completing these exercises, remember:  Muscles can gain both the endurance and the strength needed for everyday activities through controlled exercises. Complete these exercises as instructed by your physician, physical therapist or athletic trainer. Progress the resistance and repetitions only as guided. You may experience muscle soreness or fatigue, but the pain or discomfort you are trying to eliminate should never worsen during these exercises. If this pain does worsen, stop and make certain you are following the directions exactly. If the pain is still present after adjustments, discontinue the exercise until you can discuss the trouble with your clinician. If advised by your physician, during your recovery, avoid activity or exercises which involve actions that place your right / left hand or elbow above your head or behind your back or head. These positions stress the tissues which are trying to heal.  STRENGTH - Scapular Depression and Adduction With good posture, sit on a firm chair. Supported your arms in front of you with pillows, arm rests or a table top. Have your elbows in line with the sides of your body. Gently draw your shoulder blades down and toward your mid-back spine. Gradually increase the tension without tensing the muscles along the top of your  shoulders and the back of your neck. Hold for 3 seconds. Slowly release the tension and relax your muscles completely before completing the next repetition. After you have practiced this exercise, remove the arm support and complete it in standing as well as sitting. Repeat 2 times. Complete this exercise 3 times per week.   STRENGTH - External Rotators Secure a rubber exercise band/tubing to a fixed object so that it is at the same height as your right / left elbow when you are standing or sitting on a firm surface. Stand or sit so that the secured exercise band/tubing is at your side that is not injured. Bend your elbow 90 degrees. Place a folded towel or small pillow under your right / left arm so that your elbow is a few inches away from your side. Keeping the tension on the exercise band/tubing, pull it away from your body, as if pivoting on your elbow. Be sure to keep your body steady so that the movement is only coming from your shoulder rotating. Hold 3 seconds. Release the tension in a controlled manner as you return to the starting position. Repeat 2 times. Complete this exercise 3 times per  week.   STRENGTH - Supraspinatus Stand or sit with good posture. Grasp a 2-3 lb weight or an exercise band/tubing so that your hand is "thumbs-up," like when you shake hands. Slowly lift your right / left hand from your thigh into the air, traveling about 30 degrees from straight out at your side. Lift your hand to shoulder height or as far as you can without increasing any shoulder pain. Initially, many people do not lift their hands above shoulder height. Avoid shrugging your right / left shoulder as your arm rises by keeping your shoulder blade tucked down and toward your mid-back spine. Hold for 3 seconds. Control the descent of your hand as you slowly return to your starting position. Repeat 2 times. Complete this exercise 3 times per week.   STRENGTH - Shoulder Extensors Secure a rubber  exercise band/tubing so that it is at the height of your shoulders when you are either standing or sitting on a firm arm-less chair. With a thumbs-up grip, grasp an end of the band/tubing in each hand. Straighten your elbows and lift your hands straight in front of you at shoulder height. Step back away from the secured end of band/tubing until it becomes tense. Squeezing your shoulder blades together, pull your hands down to the sides of your thighs. Do not allow your hands to go behind you. Hold for 3 seconds. Slowly ease the tension on the band/tubing as you reverse the directions and return to the starting position. Repeat 2 times. Complete this exercise 3 times per week.   STRENGTH - Scapular Retractors Secure a rubber exercise band/tubing so that it is at the height of your shoulders when you are either standing or sitting on a firm arm-less chair. With a palm-down grip, grasp an end of the band/tubing in each hand. Straighten your elbows and lift your hands straight in front of you at shoulder height. Step back away from the secured end of band/tubing until it becomes tense. Squeezing your shoulder blades together, draw your elbows back as you bend them. Keep your upper arm lifted away from your body throughout the exercise. Hold 3 seconds. Slowly ease the tension on the band/tubing as you reverse the directions and return to the starting position. Repeat 2 times. Complete this exercise 3 times per week.  STRENGTH - Scapular Depressors Find a sturdy chair without wheels, such as a from a dining room table. Keeping your feet on the floor, lift your bottom from the seat and lock your elbows. Keeping your elbows straight, allow gravity to pull your body weight down. Your shoulders will rise toward your ears. Raise your body against gravity by drawing your shoulder blades down your back, shortening the distance between your shoulders and ears. Although your feet should always maintain contact  with the floor, your feet should progressively support less body weight as you get stronger. Hold 3 seconds. In a controlled and slow manner, lower your body weight to begin the next repetition. Repeat 2 times. Complete this exercise 3 times per week.    This information is not intended to replace advice given to you by your health care provider. Make sure you discuss any questions you have with your health care provider.   Document Released: 07/10/2005 Document Revised: 09/16/2014 Document Reviewed: 12/08/2008 Elsevier Interactive Patient Education [employer].

## 2022-07-13 ENCOUNTER — Other Ambulatory Visit: Payer: Self-pay | Admitting: Family Medicine

## 2022-10-31 ENCOUNTER — Telehealth: Payer: Self-pay | Admitting: *Deleted

## 2022-10-31 NOTE — Telephone Encounter (Signed)
Pt called and lvm to notifiy pt to wear a mask

## 2022-10-31 NOTE — Telephone Encounter (Signed)
Who Is Calling Patient / Member / Family / Caregiver Caller Name Errol Kresge Caller Phone Number [phone] Patient Name Xavier Gonzalez Patient DOB 1966/08/11 Call Type Message Only Information Provided Reason for Call Request for General Office Information Initial Comment Caller states he wants to know if he can come in tomorrow for his appt if he is sick. Disp. Time Disposition Final User 10/31/2022 7:24:37 AM General Information Provided Yes Delman Cheadle Call Closed By: Delman Cheadle Transaction Date/Time: 10/31/2022 7:22:19 AM (ET

## 2022-11-01 ENCOUNTER — Ambulatory Visit (INDEPENDENT_AMBULATORY_CARE_PROVIDER_SITE_OTHER): Payer: BC Managed Care – PPO | Admitting: Family Medicine

## 2022-11-01 ENCOUNTER — Encounter: Payer: Self-pay | Admitting: Family Medicine

## 2022-11-01 VITALS — BP 108/72 | HR 108 | Temp 98.9°F | Ht 72.0 in | Wt 184.4 lb

## 2022-11-01 DIAGNOSIS — H6591 Unspecified nonsuppurative otitis media, right ear: Secondary | ICD-10-CM

## 2022-11-01 DIAGNOSIS — E7800 Pure hypercholesterolemia, unspecified: Secondary | ICD-10-CM | POA: Diagnosis not present

## 2022-11-01 DIAGNOSIS — J029 Acute pharyngitis, unspecified: Secondary | ICD-10-CM

## 2022-11-01 DIAGNOSIS — F411 Generalized anxiety disorder: Secondary | ICD-10-CM

## 2022-11-01 DIAGNOSIS — R509 Fever, unspecified: Secondary | ICD-10-CM | POA: Diagnosis not present

## 2022-11-01 LAB — COMPREHENSIVE METABOLIC PANEL
ALT: 31 U/L (ref 0–53)
AST: 24 U/L (ref 0–37)
Albumin: 4.4 g/dL (ref 3.5–5.2)
Alkaline Phosphatase: 93 U/L (ref 39–117)
BUN: 13 mg/dL (ref 6–23)
CO2: 30 mEq/L (ref 19–32)
Calcium: 9.2 mg/dL (ref 8.4–10.5)
Chloride: 104 mEq/L (ref 96–112)
Creatinine, Ser: 0.92 mg/dL (ref 0.40–1.50)
GFR: 94.25 mL/min (ref >60.00)
Glucose, Bld: 116 mg/dL — ABNORMAL HIGH (ref 70–99)
Potassium: 4 mEq/L (ref 3.5–5.1)
Sodium: 142 mEq/L (ref 135–145)
Total Bilirubin: 1.8 mg/dL — ABNORMAL HIGH (ref 0.2–1.2)
Total Protein: 6.7 g/dL (ref 6.0–8.3)

## 2022-11-01 LAB — LIPID PANEL
Cholesterol: 127 mg/dL (ref 0–200)
HDL: 38.4 mg/dL — ABNORMAL LOW (ref >39.00)
LDL Cholesterol: 65 mg/dL (ref 0–99)
NonHDL: 88.52
Total CHOL/HDL Ratio: 3
Triglycerides: 117 mg/dL (ref 0.0–149.0)
VLDL: 23.4 mg/dL (ref 0.0–40.0)

## 2022-11-01 MED ORDER — AMOXICILLIN 500 MG PO CAPS: 1000.0000 mg | ORAL_CAPSULE | Freq: Every day | ORAL | 0 refills | Status: AC

## 2022-11-01 MED ORDER — PREDNISONE 20 MG PO TABS: 40.0000 mg | ORAL_TABLET | Freq: Every day | ORAL | 0 refills | Status: AC

## 2022-11-01 NOTE — Progress Notes (Signed)
Chief Complaint  Patient presents with   Follow-up    6 month Sore throat Fever Drainage 2 Negative Home covid test    Subjective Xavier Gonzalez presents for f/u anxiety.  Pt is currently being treated with Prozac 5 mg/d.  Reports doing well since treatment. No thoughts of harming self or others. No self-medication with alcohol, prescription drugs or illicit drugs. Pt is not following with a counselor/psychologist.  Hyperlipidemia Patient presents for hyperlipidemia follow up. Currently being treated with Crestor 20 mg/d and compliance with treatment thus far has been good. He denies myalgias. He is sometimes adhering to a healthy diet. Exercise: walking, golf, active at work The patient is not known to have coexisting coronary artery disease. No CP or SOB.  URI Duration: 3 days  Associated symptoms: Fever (100) and hoarseness, losing voice Denies: sinus congestion, sinus pain, rhinorrhea, itchy watery eyes, ear pain, ear drainage, sore throat, wheezing, shortness of breath, myalgia, and N/V/D Treatment to date: [place] Sick contacts: No Tested neg for covid x 2.  Past Medical History:  Diagnosis Date   GAD (generalized anxiety disorder)    Gilbert's disease    History of chicken pox    Allergies as of 11/01/2022   No Known Allergies      Medication List        Accurate as of November 01, 2022  7:30 AM. If you have any questions, ask your nurse or doctor.          amoxicillin 500 MG capsule Commonly known as: AMOXIL Take 2 capsules (1,000 mg total) by mouth daily for 10 days. Started by: Shelda Pal, DO   FLUoxetine 10 MG tablet Commonly known as: PROZAC TAKE 1 TABLET BY MOUTH EVERY DAY   L-Lysine 500 MG Tabs Take 1,000 mg by mouth daily.   predniSONE 20 MG tablet Commonly known as: DELTASONE Take 2 tablets (40 mg total) by mouth daily with breakfast for 5 days. Started by: Shelda Pal, DO   rosuvastatin 20 MG tablet Commonly  known as: CRESTOR TAKE 1 TABLET BY MOUTH EVERY DAY        Exam BP 108/72 (BP Location: Right Arm, Patient Position: Sitting, Cuff Size: Normal)   Pulse (!) 108   Temp 98.9 F (37.2 C) (Oral)   Ht 6' (1.829 m)   Wt 184 lb 6 oz (83.6 kg)   SpO2 93%   BMI 25.01 kg/m  General:  well developed, well nourished, in no apparent distress Cardiac: Slight tachycardia, regular rhythm, no bruits, no lower extremity edema Lungs: Clear to auscultation bilaterally.  No respiratory distress HEENT: Right TM with serous fluid and slight bulging, no erythema, canals are patent bilaterally without otorrhea, left TM negative, no sinus TTP, nares are patent without rhinorrhea, pharynx is erythematous with thick drainage appreciated, no tonsillar exudate or erythema Neck: No ttp over cerv LNs Psych: well oriented with normal range of affect and age-appropriate judgement/insight, alert and oriented x4.  Assessment and Plan  Generalized anxiety disorder  Pure hypercholesterolemia - Plan: Comprehensive metabolic panel, Lipid panel  Fever, unspecified fever cause - Plan: COVID-19, Flu A+B and RSV  Sore throat - Plan: COVID-19, Flu A+B and RSV, amoxicillin (AMOXIL) 500 MG capsule  Fluid level behind tympanic membrane of right ear - Plan: predniSONE (DELTASONE) 20 MG tablet  Chronic, stable.  Continue Prozac 5 mg daily. Chronic, stable.  Continue Crestor 20 mg daily.  Counseled on diet and exercise.  Check labs today. Check above, should  have results in a couple days. 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  I did tell him most cases of laryngitis are viral but given this unique situation, I will send in amoxicillin to take to cover for any bacterial etiologies as he has been having a fever. 5-day prednisone burst 40 mg daily for ear effusion. F/u in 6 months for a physical. The patient voiced understanding and agreement to the plan.  [place], DO 11/01/22 7:30 AM

## 2022-11-01 NOTE — Patient Instructions (Addendum)
Give [place] 2-3 business days to get the results of your labs back.   Keep the diet clean and stay active.  Continue to push fluids, practice good hand hygiene, and cover your mouth if you cough.  If you start having fevers, shaking or shortness of breath, seek immediate care.  OK to take Tylenol 1000 mg (2 extra strength tabs) or 975 mg (3 regular strength tabs) every 6 hours as needed.  Let us know if you need anything.

## 2022-11-02 LAB — COVID-19, FLU A+B AND RSV
Influenza A, NAA: NOT DETECTED
Influenza B, NAA: NOT DETECTED
RSV, NAA: NOT DETECTED
SARS-CoV-2, NAA: NOT DETECTED

## 2023-02-27 ENCOUNTER — Other Ambulatory Visit: Payer: Self-pay | Admitting: Family Medicine

## 2023-04-09 ENCOUNTER — Encounter (INDEPENDENT_AMBULATORY_CARE_PROVIDER_SITE_OTHER): Payer: Self-pay

## 2023-05-07 ENCOUNTER — Encounter: Payer: Self-pay | Admitting: Family Medicine

## 2023-05-07 ENCOUNTER — Ambulatory Visit: Payer: BC Managed Care – PPO | Admitting: Family Medicine

## 2023-05-07 VITALS — BP 120/80 | HR 84 | Temp 98.6°F | Ht 72.0 in | Wt 185.2 lb

## 2023-05-07 DIAGNOSIS — Z0000 Encounter for general adult medical examination without abnormal findings: Secondary | ICD-10-CM

## 2023-05-07 DIAGNOSIS — Z125 Encounter for screening for malignant neoplasm of prostate: Secondary | ICD-10-CM

## 2023-05-07 LAB — COMPREHENSIVE METABOLIC PANEL
ALT: 29 U/L (ref 0–53)
AST: 20 U/L (ref 0–37)
Albumin: 4.2 g/dL (ref 3.5–5.2)
Alkaline Phosphatase: 81 U/L (ref 39–117)
BUN: 20 mg/dL (ref 6–23)
CO2: 30 mEq/L (ref 19–32)
Calcium: 9.3 mg/dL (ref 8.4–10.5)
Chloride: 106 mEq/L (ref 96–112)
Creatinine, Ser: 0.88 mg/dL (ref 0.40–1.50)
GFR: 97.08 mL/min (ref >60.00)
Glucose, Bld: 105 mg/dL — ABNORMAL HIGH (ref 70–99)
Potassium: 4.2 mEq/L (ref 3.5–5.1)
Sodium: 143 mEq/L (ref 135–145)
Total Bilirubin: 1.9 mg/dL — ABNORMAL HIGH (ref 0.2–1.2)
Total Protein: 6.4 g/dL (ref 6.0–8.3)

## 2023-05-07 LAB — CBC
HCT: 46.9 % (ref 39.0–52.0)
Hemoglobin: 15.9 g/dL (ref 13.0–17.0)
MCHC: 34 g/dL (ref 30.0–36.0)
MCV: 93.1 fl (ref 78.0–100.0)
Platelets: 253 10*3/uL (ref 150.0–400.0)
RBC: 5.04 Mil/uL (ref 4.22–5.81)
RDW: 12.3 % (ref 11.5–15.5)
WBC: 5.4 10*3/uL (ref 4.0–10.5)

## 2023-05-07 LAB — PSA: PSA: 0.71 ng/mL (ref 0.10–4.00)

## 2023-05-07 LAB — LIPID PANEL
Cholesterol: 131 mg/dL (ref 0–200)
HDL: 37.4 mg/dL — ABNORMAL LOW (ref >39.00)
LDL Cholesterol: 67 mg/dL (ref 0–99)
NonHDL: 93.65
Total CHOL/HDL Ratio: 4
Triglycerides: 132 mg/dL (ref 0.0–149.0)
VLDL: 26.4 mg/dL (ref 0.0–40.0)

## 2023-05-07 NOTE — Progress Notes (Signed)
Chief Complaint  Patient presents with   Annual Exam    Well Male Xavier Gonzalez is here for a complete physical.   His last physical was >1 year ago.  Current diet: in general, a "so-so" diet.  Current exercise: walking, golf, some calisthenics Weight trend: stable Fatigue out of ordinary? No. Seat belt? Yes.   Advanced directive? Yes  Health maintenance Shingrix- No Colonoscopy- Yes Tetanus- Yes HIV- Yes Hep C- Yes   Past Medical History:  Diagnosis Date   GAD (generalized anxiety disorder)    Gilbert's disease    History of chicken pox       Past Surgical History:  Procedure Laterality Date   FINGER SURGERY  08/2003   HERNIA REPAIR     LAPAROSCOPIC APPENDECTOMY N/A 11/19/2018   Procedure: APPENDECTOMY LAPAROSCOPIC;  Surgeon: Chevis Pretty III, MD;  Location: [facility];  Service: General;  Laterality: N/A;   TONSILLECTOMY AND ADENOIDECTOMY Bilateral     Medications  Current Outpatient Medications on File Prior to Visit  Medication Sig Dispense Refill   FLUoxetine (PROZAC) 10 MG tablet TAKE 1 TABLET BY MOUTH EVERY DAY 90 tablet 2   L-Lysine 500 MG TABS Take 1,000 mg by mouth daily.     rosuvastatin (CRESTOR) 20 MG tablet Take 1 tablet (20 mg total) by mouth daily. 90 tablet 1    Allergies No Known Allergies  Family History Family History  Problem Relation Age of Onset   Diabetes Mother    COPD Mother    Hypertension Father    Anxiety disorder Daughter    Diabetes Maternal Grandmother    COPD Maternal Grandmother    Cancer Maternal Uncle    Colon cancer Neg Hx    Colon polyps Neg Hx    Esophageal cancer Neg Hx    Rectal cancer Neg Hx    Stomach cancer Neg Hx     Review of Systems: Constitutional:  no fevers Eye:  no recent significant change in vision Ear/Nose/Mouth/Throat:  Ears:  no hearing loss Nose/Mouth/Throat:  no complaints of nasal congestion, no sore throat Cardiovascular:  no chest pain Respiratory:  no shortness of breath Gastrointestinal:   no change in bowel habits GU:  Male: negative for dysuria, frequency Musculoskeletal/Extremities:  no joint pain Integumentary (Skin/Breast):  no abnormal skin lesions reported Neurologic:  no headaches Endocrine: No unexpected weight changes Hematologic/Lymphatic:  no abnormal bleeding  Exam There were no vitals taken for this visit. General:  well developed, well nourished, in no apparent distress Skin:  no significant moles, warts, or growths Head:  no masses, lesions, or tenderness Eyes:  pupils equal and round, sclera anicteric without injection Ears:  canals without lesions, TMs shiny without retraction, no obvious effusion, no erythema Nose:  nares patent, mucosa normal Throat/Pharynx:  lips and gingiva without lesion; tongue and uvula midline; non-inflamed pharynx; no exudates or postnasal drainage Neck: neck supple without adenopathy, thyromegaly, or masses Cardiac: RRR, no bruits, no LE edema Lungs:  clear to auscultation, breath sounds equal bilaterally, no respiratory distress Abdomen: BS+, soft, non-tender, non-distended, no masses or organomegaly noted Rectal: Deferred Musculoskeletal:  symmetrical muscle groups noted without atrophy or deformity Neuro:  gait normal; deep tendon reflexes normal and symmetric Psych: well oriented with normal range of affect and appropriate judgment/insight  Assessment and Plan  Well adult exam - Plan: CBC, Comprehensive metabolic panel, Lipid panel  Screening for prostate cancer - Plan: PSA   Well 57 y.o. male. Counseled on diet and exercise. Counseled  on risks and benefits of prostate cancer screening with PSA. The patient agrees to undergo testing. Shingrix rec'd.  Advanced directive form requested today.  Immunizations, labs, and further orders as above. Follow up in 6 mo. The patient voiced understanding and agreement to the plan.  Jilda Roche [place], DO 05/07/23 6:56 AM

## 2023-05-07 NOTE — Patient Instructions (Signed)
Give [place] 2-3 business days to get the results of your labs back.   Keep the diet clean and stay active.  Please get me a copy of your advanced directive form at your convenience.   I recommend getting the flu shot in mid October. This suggestion would change if the CDC comes out with a different recommendation.   The Shingrix vaccine (for shingles) is a 2 shot series spaced 2-6 months apart. It can make people feel low energy, achy and almost like they have the flu for 48 hours after injection. 1/5 people can have nausea and/or vomiting. Please plan accordingly when deciding on when to get this shot. Call our office for a nurse visit appointment to get this. The second shot of the series is less severe regarding the side effects, but it still lasts 48 hours.   Let us know if you need anything.

## 2023-08-22 ENCOUNTER — Other Ambulatory Visit: Payer: Self-pay | Admitting: Family Medicine

## 2023-11-12 ENCOUNTER — Encounter: Payer: Self-pay | Admitting: Family Medicine

## 2023-11-12 ENCOUNTER — Ambulatory Visit: Payer: BC Managed Care – PPO | Admitting: Family Medicine

## 2023-11-12 VITALS — BP 130/72 | HR 80 | Temp 97.9°F | Resp 18 | Ht 72.0 in | Wt 183.4 lb

## 2023-11-12 DIAGNOSIS — E7800 Pure hypercholesterolemia, unspecified: Secondary | ICD-10-CM | POA: Diagnosis not present

## 2023-11-12 DIAGNOSIS — F411 Generalized anxiety disorder: Secondary | ICD-10-CM | POA: Diagnosis not present

## 2023-11-12 LAB — COMPREHENSIVE METABOLIC PANEL
ALT: 25 U/L (ref 0–53)
AST: 20 U/L (ref 0–37)
Albumin: 4.4 g/dL (ref 3.5–5.2)
Alkaline Phosphatase: 78 U/L (ref 39–117)
BUN: 17 mg/dL (ref 6–23)
CO2: 30 meq/L (ref 19–32)
Calcium: 9.5 mg/dL (ref 8.4–10.5)
Chloride: 108 meq/L (ref 96–112)
Creatinine, Ser: 0.87 mg/dL (ref 0.40–1.50)
GFR: 97.06 mL/min (ref >60.00)
Glucose, Bld: 114 mg/dL — ABNORMAL HIGH (ref 70–99)
Potassium: 3.9 meq/L (ref 3.5–5.1)
Sodium: 144 meq/L (ref 135–145)
Total Bilirubin: 1.3 mg/dL — ABNORMAL HIGH (ref 0.2–1.2)
Total Protein: 6.4 g/dL (ref 6.0–8.3)

## 2023-11-12 LAB — LIPID PANEL
Cholesterol: 123 mg/dL (ref 0–200)
HDL: 40.2 mg/dL (ref >39.00)
LDL Cholesterol: 68 mg/dL (ref 0–99)
NonHDL: 82.56
Total CHOL/HDL Ratio: 3
Triglycerides: 72 mg/dL (ref 0.0–149.0)
VLDL: 14.4 mg/dL (ref 0.0–40.0)

## 2023-11-12 NOTE — Patient Instructions (Signed)
 Give [place] 2-3 business days to get the results of your labs back.   Keep the diet clean and stay active.  Let us know if you need anything.

## 2023-11-12 NOTE — Progress Notes (Signed)
 Chief Complaint  Patient presents with   Follow-up    Subjective: Hyperlipidemia Patient presents for Hyperlipidemia follow up. Currently taking Crestor 20 mg/d and compliance with treatment thus far has been good. He denies myalgias. He is usually adhering to a healthy diet. Exercise: golfing No CP or SOB.  The patient is not known to have coexisting coronary artery disease.  GAD Pt is currently being treated with Prozac 5 mg/d.  Reports doing well since treatment. No thoughts of harming self or others. No self-medication with alcohol, prescription drugs or illicit drugs. Pt is not following with a counselor/psychologist.  Past Medical History:  Diagnosis Date   GAD (generalized anxiety disorder)    Gilbert's disease    History of chicken pox     Objective: BP 130/72 (BP Location: Left Arm, Patient Position: Sitting, Cuff Size: Normal)   Pulse 80   Temp 97.9 F (36.6 C) (Oral)   Resp 18   Ht 6' (1.829 m)   Wt 183 lb 6.4 oz (83.2 kg)   SpO2 98%   BMI 24.87 kg/m  General: Awake, appears stated age Heart: RRR, no LE edema, no bruits Lungs: CTAB, no rales, wheezes or rhonchi. No accessory muscle use Psych: Age appropriate judgment and insight, normal affect and mood  Assessment and Plan: Generalized anxiety disorder  Pure hypercholesterolemia - Plan: Comprehensive metabolic panel, Lipid panel  Chronic, stable. Ck labs. Counseled on diet/exercise. Cont Crestor 20 mg/d. Chronic, stable. Cont Prozac 5 mg/d.  F/u in 6 mo. The patient voiced understanding and agreement to the plan.  Jilda Roche [place], DO 11/12/23  7:04 AM

## 2024-01-25 ENCOUNTER — Encounter: Payer: Self-pay | Admitting: Family Medicine

## 2024-01-26 ENCOUNTER — Other Ambulatory Visit: Payer: Self-pay | Admitting: Family Medicine

## 2024-01-26 MED ORDER — FLUOXETINE HCL 10 MG PO TABS: 10.0000 mg | ORAL_TABLET | Freq: Every day | ORAL | 2 refills | Status: AC

## 2024-02-13 ENCOUNTER — Other Ambulatory Visit: Payer: Self-pay | Admitting: Family Medicine

## 2024-05-19 ENCOUNTER — Ambulatory Visit: Admitting: Family Medicine

## 2024-05-19 ENCOUNTER — Ambulatory Visit: Payer: Self-pay | Admitting: Family Medicine

## 2024-05-19 ENCOUNTER — Other Ambulatory Visit: Payer: Self-pay

## 2024-05-19 ENCOUNTER — Encounter: Payer: Self-pay | Admitting: Family Medicine

## 2024-05-19 VITALS — BP 128/78 | HR 88 | Temp 97.6°F | Resp 16 | Ht 73.0 in | Wt 182.4 lb

## 2024-05-19 DIAGNOSIS — Z0000 Encounter for general adult medical examination without abnormal findings: Secondary | ICD-10-CM | POA: Diagnosis not present

## 2024-05-19 DIAGNOSIS — Z23 Encounter for immunization: Secondary | ICD-10-CM | POA: Diagnosis not present

## 2024-05-19 DIAGNOSIS — Z125 Encounter for screening for malignant neoplasm of prostate: Secondary | ICD-10-CM

## 2024-05-19 DIAGNOSIS — R6889 Other general symptoms and signs: Secondary | ICD-10-CM

## 2024-05-19 DIAGNOSIS — R739 Hyperglycemia, unspecified: Secondary | ICD-10-CM

## 2024-05-19 LAB — CBC
HCT: 45.9 % (ref 39.0–52.0)
Hemoglobin: 15.7 g/dL (ref 13.0–17.0)
MCHC: 34.3 g/dL (ref 30.0–36.0)
MCV: 92 fl (ref 78.0–100.0)
Platelets: 244 K/uL (ref 150.0–400.0)
RBC: 4.98 Mil/uL (ref 4.22–5.81)
RDW: 12.5 % (ref 11.5–15.5)
WBC: 5.4 K/uL (ref 4.0–10.5)

## 2024-05-19 LAB — LIPID PANEL
Cholesterol: 153 mg/dL (ref 0–200)
HDL: 41.5 mg/dL (ref >39.00)
LDL Cholesterol: 88 mg/dL (ref 0–99)
NonHDL: 111.98
Total CHOL/HDL Ratio: 4
Triglycerides: 118 mg/dL (ref 0.0–149.0)
VLDL: 23.6 mg/dL (ref 0.0–40.0)

## 2024-05-19 LAB — COMPREHENSIVE METABOLIC PANEL WITH GFR
ALT: 26 U/L (ref 0–53)
AST: 19 U/L (ref 0–37)
Albumin: 4.5 g/dL (ref 3.5–5.2)
Alkaline Phosphatase: 78 U/L (ref 39–117)
BUN: 18 mg/dL (ref 6–23)
CO2: 31 meq/L (ref 19–32)
Calcium: 9.7 mg/dL (ref 8.4–10.5)
Chloride: 105 meq/L (ref 96–112)
Creatinine, Ser: 0.87 mg/dL (ref 0.40–1.50)
GFR: 96.71 mL/min (ref >60.00)
Glucose, Bld: 111 mg/dL — ABNORMAL HIGH (ref 70–99)
Potassium: 4.4 meq/L (ref 3.5–5.1)
Sodium: 142 meq/L (ref 135–145)
Total Bilirubin: 1.6 mg/dL — ABNORMAL HIGH (ref 0.2–1.2)
Total Protein: 6.6 g/dL (ref 6.0–8.3)

## 2024-05-19 LAB — PSA: PSA: 0.89 ng/mL (ref 0.10–4.00)

## 2024-05-19 NOTE — Progress Notes (Signed)
 Chief Complaint  Patient presents with   Annual Exam    CPE    Well Male Xavier Gonzalez is here for a complete physical.   His last physical was >1 year ago.  Current diet: in general, a healthy diet.  Current exercise: golf, walking Weight trend: stable Fatigue out of ordinary? No. Seat belt? Yes.   Advanced directive? Yes  Health maintenance Shingrix- Yes Colonoscopy- Yes Tetanus- Yes HIV- Yes Hep C- Yes   Past Medical History:  Diagnosis Date   GAD (generalized anxiety disorder)    Gilbert's disease    History of chicken pox       Past Surgical History:  Procedure Laterality Date   FINGER SURGERY  08/2003   HERNIA REPAIR     LAPAROSCOPIC APPENDECTOMY N/A 11/19/2018   Procedure: APPENDECTOMY LAPAROSCOPIC;  Surgeon: Curvin Mt III, MD;  Location: [facility];  Service: General;  Laterality: N/A;   TONSILLECTOMY AND ADENOIDECTOMY Bilateral     Medications  Current Outpatient Medications on File Prior to Visit  Medication Sig Dispense Refill   FLUoxetine  (PROZAC ) 10 MG tablet Take 1 tablet (10 mg total) by mouth daily. 90 tablet 2   L-Lysine 500 MG TABS Take 1,000 mg by mouth daily.     rosuvastatin  (CRESTOR ) 20 MG tablet TAKE 1 TABLET BY MOUTH EVERY DAY 90 tablet 1   Allergies No Known Allergies  Family History Family History  Problem Relation Age of Onset   Diabetes Mother    COPD Mother    Hypertension Father    Anxiety disorder Daughter    Diabetes Maternal Grandmother    COPD Maternal Grandmother    Cancer Maternal Uncle    Colon cancer Neg Hx    Colon polyps Neg Hx    Esophageal cancer Neg Hx    Rectal cancer Neg Hx    Stomach cancer Neg Hx     Review of Systems: Constitutional:  no fevers Eye:  no recent significant change in vision Ear/Nose/Mouth/Throat:  Ears:  no hearing loss Nose/Mouth/Throat:  no complaints of nasal congestion, no sore throat Cardiovascular:  no chest pain Respiratory:  no shortness of breath Gastrointestinal:  no change  in bowel habits GU:  Male: negative for dysuria, frequency Musculoskeletal/Extremities:  no joint pain Integumentary (Skin/Breast):  no abnormal skin lesions reported Neurologic:  no headaches Endocrine: No unexpected weight changes Hematologic/Lymphatic:  no abnormal bleeding  Exam BP 128/78 (BP Location: Left Arm, Patient Position: Sitting)   Pulse 88   Temp 97.6 F (36.4 C) (Oral)   Resp 16   Ht 6' 1 (1.854 m)   Wt 182 lb 6.4 oz (82.7 kg)   SpO2 98%   BMI 24.06 kg/m  General:  well developed, well nourished, in no apparent distress Skin:  no significant moles, warts, or growths Head:  no masses, lesions, or tenderness Eyes:  pupils equal and round, sclera anicteric without injection Ears:  canals without lesions, TMs shiny without retraction, no obvious effusion, no erythema Nose:  nares patent, mucosa normal Throat/Pharynx:  lips and gingiva without lesion; tongue and uvula midline; non-inflamed pharynx; there is an asymmetry in his oropharynx with the prominence/mass on the right side that is not present on the left.  No exudates or postnasal drainage Neck: neck supple without adenopathy, thyromegaly, or masses Cardiac: RRR, no bruits, no LE edema Lungs:  clear to auscultation, breath sounds equal bilaterally, no respiratory distress Abdomen: BS+, soft, non-tender, non-distended, no masses or organomegaly noted Rectal: Deferred Musculoskeletal:  symmetrical muscle groups noted without atrophy or deformity Neuro:  gait normal; deep tendon reflexes normal and symmetric Psych: well oriented with normal range of affect and appropriate judgment/insight  Assessment and Plan  Well adult exam - Plan: CBC, Comprehensive metabolic panel with GFR, Lipid panel, Hepatitis B surface antibody,quantitative  Screening for prostate cancer - Plan: PSA  Abnormal ear, nose, and throat evaluation - Plan: Ambulatory referral to ENT  Need for pneumococcal vaccine - Plan: Pneumococcal  conjugate vaccine 20-valent (Prevnar 20)   Well 58 y.o. male. Counseled on diet and exercise. Counseled on risks and benefits of prostate cancer screening with PSA. The patient agrees to undergo testing. Advanced directive form requested today.  Immunizations, labs, and further orders as above. PCV 20 today. Screen hep B. Prominence in oropharynx; probably at asymmetric muscle group versus old infection.  I would like him to see an ENT for definitive diagnosis to be on the safe side. Follow up in 6 mo. The patient voiced understanding and agreement to the plan.  Mabel Mt [place], DO 05/19/24 7:48 AM

## 2024-05-19 NOTE — Patient Instructions (Addendum)
Give [place] 2-3 business days to get the results of your labs back.   Keep the diet clean and stay active.  Please get me a copy of your advanced directive form at your convenience.   I recommend getting the flu shot in mid October. This suggestion would change if the CDC comes out with a different recommendation.   If you do not hear anything about your referral in the next 1-2 weeks, call our office and ask for an update.   Let us know if you need anything.

## 2024-05-20 ENCOUNTER — Ambulatory Visit: Payer: Self-pay | Admitting: Family Medicine

## 2024-05-20 ENCOUNTER — Ambulatory Visit (INDEPENDENT_AMBULATORY_CARE_PROVIDER_SITE_OTHER)

## 2024-05-20 DIAGNOSIS — R739 Hyperglycemia, unspecified: Secondary | ICD-10-CM | POA: Diagnosis not present

## 2024-05-20 LAB — HEMOGLOBIN A1C: Hgb A1c MFr Bld: 5.8 % (ref 4.6–6.5)

## 2024-05-20 LAB — HEPATITIS B SURFACE ANTIBODY, QUANTITATIVE: Hep B S AB Quant (Post): <5 m[IU]/mL — ABNORMAL LOW (ref >=10)

## 2024-05-21 ENCOUNTER — Telehealth: Payer: Self-pay

## 2024-05-21 NOTE — Telephone Encounter (Signed)
 done

## 2024-06-04 ENCOUNTER — Ambulatory Visit (INDEPENDENT_AMBULATORY_CARE_PROVIDER_SITE_OTHER): Admitting: Otolaryngology

## 2024-06-04 ENCOUNTER — Encounter (INDEPENDENT_AMBULATORY_CARE_PROVIDER_SITE_OTHER): Payer: Self-pay | Admitting: Otolaryngology

## 2024-06-04 VITALS — BP 119/77 | HR 81

## 2024-06-04 DIAGNOSIS — Z0000 Encounter for general adult medical examination without abnormal findings: Secondary | ICD-10-CM

## 2024-06-04 NOTE — Progress Notes (Signed)
 ENT CONSULT:  Reason for Consult: oropharyngeal asymmetry on exam with PCP   HPI: Discussed the use of AI scribe software for clinical note transcription with the patient, who gave verbal consent to proceed.  History of Present Illness Xavier Gonzalez is a 58 year old male who presents for evaluation of a concern noted by his primary care doctor (pharyngeal asymmetry on exam).   He denies any symptoms such as pain with swallowing or trouble swallowing. No pain at rest or with talking.  He has a history of smoking, having quit approximately 27 years ago.   Records Reviewed:  PCP note from Dr Frann 05/19/24 Prominence in oropharynx; probably at asymmetric muscle group versus old infection.  I would like him to see an ENT for definitive diagnosis to be on the safe side. Follow up in 6 mo.     Past Medical History:  Diagnosis Date   GAD (generalized anxiety disorder)    Gilbert's disease    History of chicken pox     Past Surgical History:  Procedure Laterality Date   FINGER SURGERY  08/2003   HERNIA REPAIR     LAPAROSCOPIC APPENDECTOMY N/A 11/19/2018   Procedure: APPENDECTOMY LAPAROSCOPIC;  Surgeon: Curvin Deward MOULD, MD;  Location: [facility];  Service: General;  Laterality: N/A;   TONSILLECTOMY AND ADENOIDECTOMY Bilateral     Family History  Problem Relation Age of Onset   Diabetes Mother    COPD Mother    Hypertension Father    Anxiety disorder Daughter    Diabetes Maternal Grandmother    COPD Maternal Grandmother    Cancer Maternal Uncle    Colon cancer Neg Hx    Colon polyps Neg Hx    Esophageal cancer Neg Hx    Rectal cancer Neg Hx    Stomach cancer Neg Hx     Social History:  reports that he has quit smoking. He has never used smokeless tobacco. He reports current alcohol use of about 6.0 standard drinks of alcohol per week. He reports that he does not use drugs.  Allergies: No Known Allergies  Medications: I have reviewed the patient's current  medications.  The PMH, PSH, Medications, Allergies, and SH were reviewed and updated.  ROS: Constitutional: Negative for fever, weight loss and weight gain. Cardiovascular: Negative for chest pain and dyspnea on exertion. Respiratory: Is not experiencing shortness of breath at rest. Gastrointestinal: Negative for nausea and vomiting. Neurological: Negative for headaches. Psychiatric: The patient is not nervous/anxious  Blood pressure 119/77, pulse 81, SpO2 94%. There is no height or weight on file to calculate BMI.  PHYSICAL EXAM:  Exam: General: Well-developed, well-nourished Respiratory Respiratory effort: Equal inspiration and expiration without stridor Cardiovascular Peripheral Vascular: Warm extremities with equal color/perfusion Eyes: No nystagmus with equal extraocular motion bilaterally Neuro/Psych/Balance: Patient oriented to person, place, and time; Appropriate mood and affect; Gait is intact with no imbalance; Cranial nerves I-XII are intact Head and Face Inspection: Normocephalic and atraumatic without mass or lesion Palpation: Facial skeleton intact without bony stepoffs Salivary Glands: No mass or tenderness  Facial Strength: Facial motility symmetric and full bilaterally ENT Pinna: External ear intact and fully developed External canal: Canal is patent with intact skin Tympanic Membrane: Clear and mobile External Nose: No scar or anatomic deformity Internal Nose: Septum is relatively straight on anterior rhinoscopy. Bilateral inferior turbinate hypertrophy.  Lips, Teeth, and gums: Mucosa and teeth intact and viable Oral cavity/oropharynx: No erythema or exudate, no lesions present Normal pharyngeal folds  noted on both sides on oropharyngeal exam Neck Neck and Trachea: Midline trachea without mass or lesion Thyroid: No mass or nodularity Lymphatics: No lymphadenopathy   Assessment/Plan: Encounter Diagnosis  Name Primary?   Normal exam Yes    Assessment  and Plan Assessment & Plan No asymmetry noted and no masses noted on exam today - No further intervention required.      Thank you for allowing me to participate in the care of this patient. Please do not hesitate to contact me with any questions or concerns.   Elena Larry, MD Otolaryngology [facility] Phone: [phone] Fax: [phone]    06/04/2024, 10:33 AM

## 2024-08-13 ENCOUNTER — Other Ambulatory Visit: Payer: Self-pay | Admitting: Family Medicine

## 2024-11-15 ENCOUNTER — Ambulatory Visit: Admitting: Family Medicine
